# Patient Record
Sex: Female | Born: 1935 | Race: Black or African American | Hispanic: No | Smoking: Never smoker
Health system: Southern US, Community
[De-identification: ages and names within clinical notes are randomized; demographics above are authoritative.]

## PROBLEM LIST (undated history)

## (undated) DIAGNOSIS — C801 Malignant (primary) neoplasm, unspecified: Secondary | ICD-10-CM

## (undated) DIAGNOSIS — E039 Hypothyroidism, unspecified: Secondary | ICD-10-CM

## (undated) DIAGNOSIS — Z950 Presence of cardiac pacemaker: Secondary | ICD-10-CM

## (undated) DIAGNOSIS — I2510 Atherosclerotic heart disease of native coronary artery without angina pectoris: Secondary | ICD-10-CM

## (undated) DIAGNOSIS — R569 Unspecified convulsions: Secondary | ICD-10-CM

## (undated) DIAGNOSIS — I639 Cerebral infarction, unspecified: Secondary | ICD-10-CM

## (undated) DIAGNOSIS — D649 Anemia, unspecified: Secondary | ICD-10-CM

---

## 2016-12-10 ENCOUNTER — Ambulatory Visit
Admission: RE | Admit: 2016-12-10 | Discharge: 2016-12-10 | Disposition: A | Discharge status: Home / Self Care | Payer: Medicare (Managed Care) | Source: Ambulatory Visit | Attending: Obstetrics & Gynecology | Admitting: Obstetrics & Gynecology

## 2016-12-10 DIAGNOSIS — I482 Chronic atrial fibrillation: Secondary | ICD-10-CM | POA: Insufficient documentation

## 2016-12-10 DIAGNOSIS — I110 Hypertensive heart disease with heart failure: Secondary | ICD-10-CM | POA: Insufficient documentation

## 2016-12-10 DIAGNOSIS — I5022 Chronic systolic (congestive) heart failure: Secondary | ICD-10-CM | POA: Diagnosis not present

## 2016-12-10 DIAGNOSIS — Z7982 Long term (current) use of aspirin: Secondary | ICD-10-CM | POA: Diagnosis not present

## 2016-12-10 DIAGNOSIS — Z791 Long term (current) use of non-steroidal anti-inflammatories (NSAID): Secondary | ICD-10-CM | POA: Insufficient documentation

## 2016-12-10 DIAGNOSIS — D509 Iron deficiency anemia, unspecified: Secondary | ICD-10-CM | POA: Insufficient documentation

## 2016-12-10 DIAGNOSIS — I429 Cardiomyopathy, unspecified: Secondary | ICD-10-CM | POA: Insufficient documentation

## 2016-12-10 DIAGNOSIS — Z79899 Other long term (current) drug therapy: Secondary | ICD-10-CM | POA: Diagnosis not present

## 2016-12-10 DIAGNOSIS — Z950 Presence of cardiac pacemaker: Secondary | ICD-10-CM | POA: Diagnosis not present

## 2016-12-10 DIAGNOSIS — Z952 Presence of prosthetic heart valve: Secondary | ICD-10-CM | POA: Diagnosis not present

## 2016-12-10 HISTORY — DX: Hypothyroidism, unspecified: E03.9

## 2016-12-10 HISTORY — DX: Cerebral infarction, unspecified: I63.9

## 2016-12-10 HISTORY — DX: Presence of cardiac pacemaker: Z95.0

## 2016-12-10 HISTORY — DX: Atherosclerotic heart disease of native coronary artery without angina pectoris: I25.10

## 2016-12-10 HISTORY — DX: Anemia, unspecified: D64.9

## 2016-12-10 LAB — HEMOGLOBIN AND HEMATOCRIT, BLOOD
HEMATOCRIT: 20.8 % — AB (ref 35.0–47.0)
Hemoglobin: 6.5 g/dL — ABNORMAL LOW (ref 12.0–16.0)

## 2016-12-10 LAB — ABO/RH: ABO/RH(D): A POS

## 2016-12-10 LAB — PREPARE RBC (CROSSMATCH)

## 2016-12-10 MED ORDER — FUROSEMIDE 10 MG/ML IJ SOLN
INTRAMUSCULAR | Status: AC
  Administered 2016-12-10: 20 mg via INTRAVENOUS
  Filled 2016-12-10: qty 2

## 2016-12-10 MED ORDER — SODIUM CHLORIDE FLUSH 0.9 % IV SOLN
INTRAVENOUS | Status: AC
  Filled 2016-12-10: qty 10

## 2016-12-10 MED ORDER — ACETAMINOPHEN 325 MG PO TABS
650.0000 mg | ORAL_TABLET | Freq: Once | ORAL | Status: AC
  Administered 2016-12-10: 650 mg via ORAL

## 2016-12-10 MED ORDER — FUROSEMIDE 10 MG/ML IJ SOLN
20.0000 mg | Freq: Once | INTRAMUSCULAR | Status: AC
  Administered 2016-12-10: 20 mg via INTRAVENOUS

## 2016-12-10 MED ORDER — ACETAMINOPHEN 325 MG PO TABS
ORAL_TABLET | ORAL | Status: AC
  Administered 2016-12-10: 650 mg via ORAL
  Filled 2016-12-10: qty 2

## 2016-12-10 MED ORDER — DIPHENHYDRAMINE HCL 25 MG PO CAPS
25.0000 mg | ORAL_CAPSULE | Freq: Once | ORAL | Status: AC
  Administered 2016-12-10: 25 mg via ORAL

## 2016-12-10 MED ORDER — DIPHENHYDRAMINE HCL 25 MG PO CAPS
ORAL_CAPSULE | ORAL | Status: AC
  Administered 2016-12-10: 25 mg via ORAL
  Filled 2016-12-10: qty 1

## 2016-12-12 LAB — TYPE AND SCREEN
ABO/RH(D): A POS
Antibody Screen: NEGATIVE
Unit division: 0
Unit division: 0

## 2016-12-12 LAB — BPAM RBC
BLOOD PRODUCT EXPIRATION DATE: 201804092359
BLOOD PRODUCT EXPIRATION DATE: 201804202359
ISSUE DATE / TIME: 201803301238
ISSUE DATE / TIME: 201803301509
UNIT TYPE AND RH: 6200
Unit Type and Rh: 6200

## 2017-04-30 ENCOUNTER — Emergency Department
Admission: EM | Admit: 2017-04-30 | Discharge: 2017-04-30 | Disposition: A | Discharge status: Home / Self Care | Payer: Medicare (Managed Care) | Attending: Emergency Medicine | Admitting: Emergency Medicine

## 2017-04-30 ENCOUNTER — Emergency Department: Payer: Medicare (Managed Care)

## 2017-04-30 ENCOUNTER — Encounter: Payer: Self-pay | Admitting: Emergency Medicine

## 2017-04-30 DIAGNOSIS — Z79899 Other long term (current) drug therapy: Secondary | ICD-10-CM | POA: Diagnosis not present

## 2017-04-30 DIAGNOSIS — Z7982 Long term (current) use of aspirin: Secondary | ICD-10-CM | POA: Diagnosis not present

## 2017-04-30 DIAGNOSIS — R4182 Altered mental status, unspecified: Secondary | ICD-10-CM | POA: Diagnosis present

## 2017-04-30 DIAGNOSIS — Z950 Presence of cardiac pacemaker: Secondary | ICD-10-CM | POA: Insufficient documentation

## 2017-04-30 DIAGNOSIS — Z85038 Personal history of other malignant neoplasm of large intestine: Secondary | ICD-10-CM | POA: Insufficient documentation

## 2017-04-30 DIAGNOSIS — I69354 Hemiplegia and hemiparesis following cerebral infarction affecting left non-dominant side: Secondary | ICD-10-CM | POA: Diagnosis not present

## 2017-04-30 DIAGNOSIS — R55 Syncope and collapse: Secondary | ICD-10-CM | POA: Diagnosis not present

## 2017-04-30 DIAGNOSIS — I2510 Atherosclerotic heart disease of native coronary artery without angina pectoris: Secondary | ICD-10-CM | POA: Insufficient documentation

## 2017-04-30 DIAGNOSIS — E039 Hypothyroidism, unspecified: Secondary | ICD-10-CM | POA: Insufficient documentation

## 2017-04-30 DIAGNOSIS — Z87891 Personal history of nicotine dependence: Secondary | ICD-10-CM | POA: Diagnosis not present

## 2017-04-30 HISTORY — DX: Malignant (primary) neoplasm, unspecified: C80.1

## 2017-04-30 LAB — CBC WITH DIFFERENTIAL/PLATELET
BASOS PCT: 1 %
Basophils Absolute: 0.1 10*3/uL (ref 0–0.1)
EOS ABS: 0.2 10*3/uL (ref 0–0.7)
EOS PCT: 3 %
HEMATOCRIT: 31.2 % — AB (ref 35.0–47.0)
Hemoglobin: 10 g/dL — ABNORMAL LOW (ref 12.0–16.0)
LYMPHS PCT: 46 %
Lymphs Abs: 3 10*3/uL (ref 1.0–3.6)
MCH: 27.2 pg (ref 26.0–34.0)
MCHC: 32.2 g/dL (ref 32.0–36.0)
MCV: 84.6 fL (ref 80.0–100.0)
Monocytes Absolute: 0.5 10*3/uL (ref 0.2–0.9)
Monocytes Relative: 8 %
NEUTROS ABS: 2.7 10*3/uL (ref 1.4–6.5)
Neutrophils Relative %: 42 %
Platelets: 201 10*3/uL (ref 150–440)
RBC: 3.68 MIL/uL — ABNORMAL LOW (ref 3.80–5.20)
RDW: 18.1 % — AB (ref 11.5–14.5)
WBC: 6.5 10*3/uL (ref 3.6–11.0)

## 2017-04-30 LAB — COMPREHENSIVE METABOLIC PANEL
ALBUMIN: 3.9 g/dL (ref 3.5–5.0)
ALT: 20 U/L (ref 14–54)
AST: 42 U/L — AB (ref 15–41)
Alkaline Phosphatase: 66 U/L (ref 38–126)
Anion gap: 11 (ref 5–15)
BUN: 24 mg/dL — AB (ref 6–20)
CHLORIDE: 110 mmol/L (ref 101–111)
CO2: 18 mmol/L — ABNORMAL LOW (ref 22–32)
Calcium: 8.9 mg/dL (ref 8.9–10.3)
Creatinine, Ser: 1.42 mg/dL — ABNORMAL HIGH (ref 0.44–1.00)
GFR calc Af Amer: 37 mL/min — ABNORMAL LOW (ref >60)
GFR calc non Af Amer: 32 mL/min — ABNORMAL LOW (ref >60)
GLUCOSE: 162 mg/dL — AB (ref 65–99)
POTASSIUM: 3.7 mmol/L (ref 3.5–5.1)
Sodium: 139 mmol/L (ref 135–145)
Total Bilirubin: 0.7 mg/dL (ref 0.3–1.2)
Total Protein: 7.6 g/dL (ref 6.5–8.1)

## 2017-04-30 LAB — TYPE AND SCREEN
ABO/RH(D): A POS
ANTIBODY SCREEN: NEGATIVE

## 2017-04-30 LAB — URINALYSIS, COMPLETE (UACMP) WITH MICROSCOPIC
BILIRUBIN URINE: NEGATIVE
Bacteria, UA: NONE SEEN
Glucose, UA: NEGATIVE mg/dL
Hgb urine dipstick: NEGATIVE
Ketones, ur: NEGATIVE mg/dL
LEUKOCYTES UA: NEGATIVE
Nitrite: NEGATIVE
PH: 5 (ref 5.0–8.0)
Protein, ur: 100 mg/dL — AB
SPECIFIC GRAVITY, URINE: 1.015 (ref 1.005–1.030)

## 2017-04-30 LAB — MAGNESIUM: Magnesium: 1.7 mg/dL (ref 1.7–2.4)

## 2017-04-30 LAB — PROTIME-INR
INR: 1.23
PROTHROMBIN TIME: 15.6 s — AB (ref 11.4–15.2)

## 2017-04-30 LAB — GLUCOSE, CAPILLARY: Glucose-Capillary: 89 mg/dL (ref 65–99)

## 2017-04-30 LAB — TROPONIN I

## 2017-04-30 MED ORDER — METOPROLOL TARTRATE 25 MG PO TABS
25.0000 mg | ORAL_TABLET | Freq: Once | ORAL | Status: AC
  Administered 2017-04-30: 25 mg via ORAL
  Filled 2017-04-30: qty 1

## 2017-04-30 MED ORDER — ACETAMINOPHEN 325 MG PO TABS
650.0000 mg | ORAL_TABLET | Freq: Once | ORAL | Status: AC
  Administered 2017-04-30: 650 mg via ORAL
  Filled 2017-04-30: qty 2

## 2017-04-30 MED ORDER — METOPROLOL TARTRATE 5 MG/5ML IV SOLN
5.0000 mg | Freq: Once | INTRAVENOUS | Status: AC
  Administered 2017-04-30: 5 mg via INTRAVENOUS
  Filled 2017-04-30: qty 5

## 2017-04-30 MED ORDER — FENTANYL CITRATE (PF) 100 MCG/2ML IJ SOLN
50.0000 ug | Freq: Once | INTRAMUSCULAR | Status: AC
  Administered 2017-04-30: 25 ug via INTRAVENOUS
  Filled 2017-04-30: qty 2

## 2017-04-30 MED ORDER — FENTANYL CITRATE (PF) 100 MCG/2ML IJ SOLN
INTRAMUSCULAR | Status: AC
  Administered 2017-04-30: 25 ug via INTRAVENOUS
  Filled 2017-04-30: qty 2

## 2017-04-30 NOTE — ED Triage Notes (Addendum)
Patient is baseline nonverbal with left sided weakness S/P stroke. Presents today after having a syncopal episode while using the bathroom. Family states they placed her in the wheelchair and patient was non-responsive with respirations at 4/min. Patient presents to the ED whimpering, moving all 4 extremities. Left sided weakness again noted, but +AROM.   From Home via [place]

## 2017-04-30 NOTE — ED Notes (Signed)
Pt Family Notified of  Room Assignment

## 2017-04-30 NOTE — ED Notes (Signed)
Dr. Leslye Peer to bedside at this time to speak with patient's daughter regarding options of admission and transfer vs not admitting and transferring.

## 2017-04-30 NOTE — ED Provider Notes (Addendum)
[facility] Emergency Department Provider Note  ____________________________________________   I have reviewed the triage vital signs and the nursing notes.   HISTORY  Chief Complaint Altered Mental Status    HPI Linda Petersen is a 83 y.o. female who presents today after having a brief period of unresponsiveness on the toilet this morning. Patient has a history of chronic left-sided weakness, she his wheelchair limited, coronary artery disease hypothyroidism anemia colon cancer endograft of a heart valve, she recently had significant surgery for colon cancer, at that time, in June, she also had had a GI bleed but has not had that since that time. Her metoprolol was increased approximately a week ago, and she's been doing okay since that time. She is apparently full code. This morning, she seemed okay when she woke up. The family helped her onto the toilet as it normally do but when she did not call out after a minute, they went in and found her slumped over. She seemed to be nonresponsive. EMS arrived, according to EMS, patient rapidly returned to being awake alert with reassuring vital signs. Patient could not give any history, nor can she at this time, coLevel 5 chart caveat; no further history available due to patient status. Patient is however awake guarding her airway and will follow some commands.  History is per her son, and EMS. As well as a review of medical records from [facility].   Past Medical History:  Diagnosis Date  . Anemia   . Cancer [facility])    Colon  . Coronary artery disease   . Hypothyroidism   . Presence of permanent cardiac pacemaker   . Stroke [facility])    left side weakness    There are no active problems to display for this patient.   History reviewed. No pertinent surgical history.  Prior to Admission medications   Medication Sig Start Date End Date Taking? Authorizing Provider  aspirin EC 81 MG tablet Take 81 mg by  mouth daily.    [provider]  cholecalciferol (VITAMIN D) 400 units TABS tablet Take 1,000 Units by mouth daily.    [provider]  diclofenac sodium (VOLTAREN) 1 % GEL Apply 2 g topically 2 (two) times daily.    [provider]  ferrous sulfate 325 (65 FE) MG tablet Take 325 mg by mouth 2 (two) times daily with a meal.    [provider]  furosemide (LASIX) 20 MG tablet Take 20 mg by mouth every other day.    [provider]  levothyroxine (SYNTHROID, LEVOTHROID) 50 MCG tablet Take 50 mcg by mouth daily before breakfast.    [provider]  loperamide (IMODIUM) 2 MG capsule Take by mouth as needed for diarrhea or loose stools.    [provider]  losartan (COZAAR) 50 MG tablet Take 50 mg by mouth daily.    [provider]  magnesium oxide (MAG-OX) 400 MG tablet Take 400 mg by mouth daily.    [provider]  metoprolol succinate (TOPROL-XL) 25 MG 24 hr tablet Take 25 mg by mouth daily.    [provider]  pravastatin (PRAVACHOL) 80 MG tablet Take 80 mg by mouth daily.    [provider]  ranitidine (ZANTAC) 150 MG tablet Take 150 mg by mouth 2 (two) times daily.    [provider]    Allergies Patient has no known allergies.  History reviewed. No pertinent family history.  Social History Social History  Substance  Use Topics  . Smoking status: Never Smoker  . Smokeless tobacco: Former Systems developer  . Alcohol use Not on file    Review of Systems, According to family: Constitutional: No fever/chills Eyes: No visual changes. ENT: No sore throat. No stiff neck no neck pain Cardiovascular: Denies chest pain. Respiratory: Denies shortness of breath. Gastrointestinal:   no vomiting.  No diarrhea.  No constipation. Genitourinary: Negative for dysuria. Musculoskeletal: Negative lower extremity swelling Skin: Negative for rash. Neurological: Negative for severe headaches or new, focal  weakness or numbness.   ____________________________________________   PHYSICAL EXAM:  VITAL SIGNS: ED Triage Vitals  Enc Vitals Group     BP 04/30/17 0718 (!) 186/79     Pulse Rate 04/30/17 0718 95     Resp 04/30/17 0718 16     Temp --      Temp src --      SpO2 04/30/17 0718 100 %     Weight 04/30/17 0719 138 lb 8 oz (62.8 kg)     Height --      Head Circumference --      Peak Flow --      Pain Score --      Pain Loc --      Pain Edu? --      Excl. in [place]? --     Constitutional: Chronically ill-appearing, alert, in no acute distress somewhat agitated, guarding her airway, Eyes: Conjunctivae are normal Head: Atraumatic HEENT: No congestion/rhinnorhea. Mucous membranes are moist.  Oropharynx non-erythematous Neck:   Nontender with no meningismus, no masses, no stridor Cardiovascular: Normal rate, regular rhythm. Grossly mildest to murmur appreciated.  Good peripheral circulation. Respiratory: Normal respiratory effort.  No retractions. Lungs CTAB. Abdominal: Soft and nontender. No distention. No guarding no rebound Back:  There is no focal tenderness or step off.  there is no midline tenderness there are no lesions noted. there is no CVA tenderness Musculoskeletal: No lower extremity tenderness, no upper extremity tenderness. No joint effusions, no DVT signs strong distal pulses no edema Neurologic:  Will follow some commands, will not speak to me initially, has a left-sided weakness on the left upper and left lower extremity which is known to be old. Skin:  Skin is warm, dry and intact. No rash noted.  ____________________________________________   LABS (all labs ordered are listed, but only abnormal results are displayed)  Labs Reviewed  CBC WITH DIFFERENTIAL/PLATELET  MAGNESIUM  TROPONIN I  COMPREHENSIVE METABOLIC PANEL  PROTIME-INR  URINALYSIS, COMPLETE (UACMP) WITH MICROSCOPIC  TYPE AND SCREEN   ____________________________________________  EKG  I  personally interpreted any EKGs ordered by me or triage The ventricular  paced rhythm rate 103 mild tachycardia noted, ____________________________________________  RADIOLOGY  I reviewed any imaging ordered by me or triage that were performed during my shift and, if possible, patient and/or family made aware of any abnormal findings. ____________________________________________   PROCEDURES  Procedure(s) performed: None  Procedures  Critical Care performed: CRITICAL CARE Performed by: Schuyler Amor   Total critical care time: 40 minutes  Critical care time was exclusive of separately billable procedures and treating other patients.  Critical care was necessary to treat or prevent imminent or life-threatening deterioration.  Critical care was time spent personally by me on the following activities: development of treatment plan with patient and/or surrogate as well as nursing, discussions with consultants, evaluation of patient's response to treatment, examination of patient, obtaining history from patient or surrogate, ordering and performing treatments and interventions, ordering and  review of laboratory studies, ordering and review of radiographic studies, pulse oximetry and re-evaluation of patient's condition.   ____________________________________________   INITIAL IMPRESSION / ASSESSMENT AND PLAN / ED COURSE  Pertinent labs & imaging results that were available during my care of the patient were reviewed by me and considered in my medical decision making (see chart for details).  Patient here after a syncopal event on the toe this morning. She apparently had "agonal" respirations. EMS was recovered almost immediately upon their arrival. She is in no acute distress, she does seem to be close to her baseline to far as we can determine. Sinuses with her at this time. We will obtain a CT scan of her head given her history altered mental status and multiple medical problems.  There was no report of rectal bleed on the toilet but we will send a type and screen, coags, given recent GI bleed, patient vital signs are reassuring, we will send a urinalysis which we have obtained by catheterized urine, we will obtain basic blood work including magnesium for which she takes supplementation, chest x-ray, cardiac enzymes and we will reassess. We will monitor the patient closely while she is here.  ----------------------------------------- 8:04 AM on 04/30/2017 -----------------------------------------  Patient in CT scan, son feels that she is close to her baseline, she is denying pain to me and him. After some investigation it appears the patient has a Medtronic pacemaker implanted in 2013, we will interrogate.  ----------------------------------------- 8:28 AM on 04/30/2017 -----------------------------------------  Blood work pending, urinalysis reassuring, CT head reassuring, chest x-ray reassuring,  UA reassuring. Family is concerned that she may be in pain, she seems to be agitated to them. I did again talk to the family they state that she did not actually fall off the commode but she "slumped over" and was leaning against a counter on her left. I don't see any obvious evidence of injury but we'll obtain hip x-rays that she seems to be gesturing towards her hips. She is answering somewhat yes or no to questions. They state that she is near her baseline in terms of her ability to speak. She is limited in speech after her stroke, she does not have her false teeth in. They feel that this is about how she would normally be. We will continue to observe her. I'm reluctant to give her narcotic pain medication given her questionable altered mental status prior to coming in. She did, of note, have a normal sugar for EMS. She is awake and medically speaking appears to be stable for the moment we're still trying to workup the various possible etiologies for her  presentation  ----------------------------------------- 9:35 AM on 04/30/2017 -----------------------------------------  Giving the patient pain medication she does have a occult broken hip. Paging the hospitalist and orthopedic surgery. I've also interrogate pacemaker does appear that she had an AV rate that was quite elevated this morning which probably caused her syncopal event. Even though the family states she did not really fall off the toilet slipped over still on the toilet somehow she has sustained a hip fracture. Therefore I think we have determine the etiology of her syncopal event as well as the sequela, we'll treat her with narcotic pain medication and admitted the hospital.   ----------------------------------------- 9:43 AM on 04/30/2017 -----------------------------------------  Giving the patient narcotic pain medication, but the son was requesting that she be transferred to [facility] or her cardiologist are. We are calling [facility]. Given that she has a broken hip as  well as cardiogenic syncope, she'll likely be best placed on the medicine service.  Risks benefits and alternative to transfer understood by family, who insist on transfer.  ----------------------------------------- 10:03 AM on 04/30/2017 -----------------------------------------  Discussed with Dr. Josefa Half, he and I discussed the patient's history and findings, he does agree with 5 of IV Lopressor and administering the patient's oral Lopressor which she has not yet taken today and attempt at atrial rate control which is evidently not sufficient given my Medtronic readouts here. This way, if she has another breakthrough episode of AV with RVR, hopefully she will not syncopize. Patient's pain is very well controlled at this time after fentanyl we'll continue to administer as needed. Awaiting follow-up from [facility]. Family kept appraised of situation.      ----------------------------------------- 10:33 AM on 04/30/2017 -----------------------------------------  Patient resting currently, I have made the family aware that [place], Dr. Neta Mends, excepts the patient however, they have no beds and any other facilities. Family are confident that they personally called her cardiologist they will be able to make a bed for her. I've explained and this is not something I can control however we are happy to accommodate him in any way we can I did also emphasize that we are happy to admit the patient to this facility should they elect to do that and not wait in the emergency room for an indeterminate period of time for either a deep bed to show up for her [facility] cardiologist to arrange for one  ----------------------------------------- 12:01 PM on 04/30/2017 -----------------------------------------  They do not have beds at [facility] I am told and it is unclear when they will have them. They may have somewhat decreased in all but the family's adamant that she should not go there. They do not wish to talk to any orthopedic surgeons here about the patient's hip. They do not wish an orthopedic surgeon consult. They do consent however to admission to this hospital given the likelihood of a long wait for admission to [facility]. Patient is in no acute distress and comfortable at this time. Family does not wish to receive any further fentanyl at this time they state Tylenol should be sufficient for her. We will certainly watch her pain what she is here but at this time she is comfortable. The family has many demands and we are trying to accommodate them as well as possible.   ----------------------------------------- 1:44 PM on 04/30/2017 -----------------------------------------  Pt in nad awaiting either transfer, or admission here. The hospitalist service, in order to forestall possible admission with a subsequent only generated request for transfer which would be much  more difficult to her, the patient,  has reinitiated contact with [facility]. I just received a call back from [facility], they do not have any beds available they do not know when a bed will be available we will again let the family know about this and we will see what they wish to do. It is possible a bed will be available later this afternoon at [facility] and it is possible that will not be oneuntil tomorrow or even later in the future. Family are continuing to decline orthopedic consultation.    ____________________________________________   FINAL CLINICAL IMPRESSION(S) / ED DIAGNOSES  Final diagnoses:  None      This chart was dictated using voice recognition software.  Despite best efforts to proofread,  errors can occur which can change meaning.      Schuyler Amor, MD 04/30/17 [phone]    Burlene Arnt,  Gerda Diss, MD 04/30/17 [id number]    Schuyler Amor, MD 04/30/17 0830    Schuyler Amor, MD 04/30/17 2025    Schuyler Amor, MD 04/30/17 1004    Schuyler Amor, MD 04/30/17 1034    Schuyler Amor, MD 04/30/17 1201    Schuyler Amor, MD 04/30/17 1204    Schuyler Amor, MD 04/30/17 1207    Schuyler Amor, MD 04/30/17 1308    Schuyler Amor, MD 04/30/17 1351

## 2017-04-30 NOTE — ED Notes (Addendum)
Pt's daughter confronted this nurse about administration of metoprolol stating "absolutly not, do you know what narcotics do to the elderly?"  Family education given  Family also shows deep concern over transferring the patient to [place], repeatedly threatening litigation.

## 2017-04-30 NOTE — ED Notes (Signed)
Son requesting pain medication for patient, MD notified. No new Orders

## 2017-04-30 NOTE — ED Notes (Signed)
Please see MD notations regarding possible transfer / patient family's response.

## 2017-04-30 NOTE — Progress Notes (Signed)
Patient ID: Linda Petersen, female   DOB: 10-Apr-1928, 83 y.o.   MRN: [id number] Spoke with the patient's daughter. She would like the patient to be transferred to [facility]. They would not like any operation for consultations if admitted here. The ER physician called the ER and they don't have beds. I called the transfer center and I'm waiting to hear back on whether they can accept the patient. I will hold off on admitting the patient here until I hear back from [facility].  Dr. Loletha Grayer

## 2017-04-30 NOTE — ED Notes (Signed)
Son at bedside.

## 2017-04-30 NOTE — ED Notes (Signed)
Medtronic Pacemaker/Defib Interrogated, awaiting interpretation

## 2017-12-08 ENCOUNTER — Emergency Department: Payer: Medicare (Managed Care)

## 2017-12-08 ENCOUNTER — Encounter: Payer: Self-pay | Admitting: Emergency Medicine

## 2017-12-08 ENCOUNTER — Emergency Department
Admission: EM | Admit: 2017-12-08 | Discharge: 2017-12-08 | Disposition: A | Discharge status: Other Acute Inpatient Hospital | Payer: Medicare (Managed Care) | Attending: Student in an Organized Health Care Education/Training Program | Admitting: Student in an Organized Health Care Education/Training Program

## 2017-12-08 DIAGNOSIS — Z85038 Personal history of other malignant neoplasm of large intestine: Secondary | ICD-10-CM | POA: Insufficient documentation

## 2017-12-08 DIAGNOSIS — R4182 Altered mental status, unspecified: Secondary | ICD-10-CM | POA: Diagnosis present

## 2017-12-08 DIAGNOSIS — Z79899 Other long term (current) drug therapy: Secondary | ICD-10-CM | POA: Insufficient documentation

## 2017-12-08 DIAGNOSIS — Z7901 Long term (current) use of anticoagulants: Secondary | ICD-10-CM | POA: Insufficient documentation

## 2017-12-08 DIAGNOSIS — E039 Hypothyroidism, unspecified: Secondary | ICD-10-CM | POA: Insufficient documentation

## 2017-12-08 DIAGNOSIS — Z8673 Personal history of transient ischemic attack (TIA), and cerebral infarction without residual deficits: Secondary | ICD-10-CM | POA: Insufficient documentation

## 2017-12-08 DIAGNOSIS — R4189 Other symptoms and signs involving cognitive functions and awareness: Secondary | ICD-10-CM

## 2017-12-08 DIAGNOSIS — G40901 Epilepsy, unspecified, not intractable, with status epilepticus: Secondary | ICD-10-CM | POA: Insufficient documentation

## 2017-12-08 DIAGNOSIS — I2510 Atherosclerotic heart disease of native coronary artery without angina pectoris: Secondary | ICD-10-CM | POA: Insufficient documentation

## 2017-12-08 LAB — URINE DRUG SCREEN, QUALITATIVE (ARMC ONLY)
Amphetamines, Ur Screen: NOT DETECTED
BARBITURATES, UR SCREEN: NOT DETECTED
BENZODIAZEPINE, UR SCRN: POSITIVE — AB
CANNABINOID 50 NG, UR ~~LOC~~: NOT DETECTED
Cocaine Metabolite,Ur ~~LOC~~: NOT DETECTED
MDMA (Ecstasy)Ur Screen: NOT DETECTED
Methadone Scn, Ur: NOT DETECTED
OPIATE, UR SCREEN: NOT DETECTED
PHENCYCLIDINE (PCP) UR S: NOT DETECTED
Tricyclic, Ur Screen: NOT DETECTED

## 2017-12-08 LAB — COMPREHENSIVE METABOLIC PANEL
ALBUMIN: 3.8 g/dL (ref 3.5–5.0)
ALK PHOS: 67 U/L (ref 38–126)
ALT: 18 U/L (ref 14–54)
ANION GAP: 18 — AB (ref 5–15)
AST: 52 U/L — ABNORMAL HIGH (ref 15–41)
BUN: 30 mg/dL — AB (ref 6–20)
CO2: 18 mmol/L — ABNORMAL LOW (ref 22–32)
CREATININE: 1.58 mg/dL — AB (ref 0.44–1.00)
Calcium: 9.1 mg/dL (ref 8.9–10.3)
Chloride: 101 mmol/L (ref 101–111)
GFR calc Af Amer: 33 mL/min — ABNORMAL LOW (ref >60)
GFR calc non Af Amer: 28 mL/min — ABNORMAL LOW (ref >60)
GLUCOSE: 160 mg/dL — AB (ref 65–99)
Potassium: 3.7 mmol/L (ref 3.5–5.1)
Sodium: 137 mmol/L (ref 135–145)
TOTAL PROTEIN: 7.8 g/dL (ref 6.5–8.1)
Total Bilirubin: 0.5 mg/dL (ref 0.3–1.2)

## 2017-12-08 LAB — BLOOD GAS, ARTERIAL
Acid-Base Excess: 0.7 mmol/L (ref 0.0–2.0)
Bicarbonate: 21.1 mmol/L (ref 20.0–28.0)
DRAWN BY: 449561
FIO2: 35
O2 Saturation: 97.8 %
PEEP: 5 cmH2O
Patient temperature: 37
RATE: 16 resp/min
VT: 450 mL
pCO2 arterial: 22 mmHg — ABNORMAL LOW (ref 32.0–48.0)
pH, Arterial: 7.59 — ABNORMAL HIGH (ref 7.350–7.450)
pO2, Arterial: 84 mmHg (ref 83.0–108.0)

## 2017-12-08 LAB — DIFFERENTIAL
BASOS ABS: 0 10*3/uL (ref 0–0.1)
Band Neutrophils: 3 %
Basophils Relative: 0 %
EOS PCT: 1 %
Eosinophils Absolute: 0.1 10*3/uL (ref 0–0.7)
Lymphocytes Relative: 52 %
Lymphs Abs: 2.8 10*3/uL (ref 1.0–3.6)
METAMYELOCYTES PCT: 1 %
MONOS PCT: 6 %
Monocytes Absolute: 0.3 10*3/uL (ref 0.2–0.9)
Neutro Abs: 2.2 10*3/uL (ref 1.4–6.5)
Neutrophils Relative %: 37 %

## 2017-12-08 LAB — CBC
HCT: 36.5 % (ref 35.0–47.0)
Hemoglobin: 11.8 g/dL — ABNORMAL LOW (ref 12.0–16.0)
MCH: 29.8 pg (ref 26.0–34.0)
MCHC: 32.2 g/dL (ref 32.0–36.0)
MCV: 92.6 fL (ref 80.0–100.0)
PLATELETS: 182 10*3/uL (ref 150–440)
RBC: 3.94 MIL/uL (ref 3.80–5.20)
RDW: 18.1 % — AB (ref 11.5–14.5)
WBC: 5.4 10*3/uL (ref 3.6–11.0)

## 2017-12-08 LAB — APTT: aPTT: 30 seconds (ref 24–36)

## 2017-12-08 LAB — PROTIME-INR
INR: 1.26
PROTHROMBIN TIME: 15.7 s — AB (ref 11.4–15.2)

## 2017-12-08 LAB — ETHANOL: Alcohol, Ethyl (B): <10 mg/dL (ref <10)

## 2017-12-08 LAB — TROPONIN I: Troponin I: 0.03 ng/mL (ref <0.03)

## 2017-12-08 MED ORDER — ETOMIDATE 2 MG/ML IV SOLN
20.0000 mg | Freq: Once | INTRAVENOUS | Status: AC
  Administered 2017-12-08: 20 mg via INTRAVENOUS

## 2017-12-08 MED ORDER — SODIUM CHLORIDE 0.9 % IV SOLN
Freq: Once | INTRAVENOUS | Status: AC
  Administered 2017-12-08: 16:00:00 via INTRAVENOUS

## 2017-12-08 MED ORDER — SODIUM CHLORIDE 0.9 % IV SOLN: 40.0000 ug/h | INTRAVENOUS | Status: DC

## 2017-12-08 MED ORDER — SODIUM CHLORIDE 0.9 % IV SOLN
Freq: Once | INTRAVENOUS | Status: AC
  Administered 2017-12-08: 16:00:00 via INTRAVENOUS
  Filled 2017-12-08: qty 10

## 2017-12-08 MED ORDER — FENTANYL 2500MCG IN NS 250ML (10MCG/ML) PREMIX INFUSION
40.0000 ug/h | INTRAVENOUS | Status: DC
  Filled 2017-12-08: qty 250

## 2017-12-08 MED ORDER — MIDAZOLAM HCL 50 MG/10ML IJ SOLN
0.5000 mg/h | INTRAMUSCULAR | Status: DC
  Administered 2017-12-08: 0.5 mg/h via INTRAVENOUS
  Filled 2017-12-08: qty 10

## 2017-12-08 MED ORDER — LEVETIRACETAM IN NACL 1000 MG/100ML IV SOLN: 1000.0000 mg | Freq: Once | INTRAVENOUS | Status: DC

## 2017-12-08 MED ORDER — IOPAMIDOL (ISOVUE-370) INJECTION 76%
60.0000 mL | Freq: Once | INTRAVENOUS | Status: AC | PRN
  Administered 2017-12-08: 16:00:00 via INTRAVENOUS

## 2017-12-08 MED ORDER — FENTANYL CITRATE (PF) 100 MCG/2ML IJ SOLN
100.0000 ug | Freq: Once | INTRAMUSCULAR | Status: AC
  Administered 2017-12-08: 100 ug via INTRAVENOUS

## 2017-12-08 MED ORDER — ROCURONIUM BROMIDE 100 MG/10ML IV SOLN
100.0000 mg | Freq: Once | INTRAVENOUS | Status: AC
  Administered 2017-12-08: 100 mg via INTRAVENOUS
  Filled 2017-12-08: qty 10

## 2017-12-08 MED ORDER — FENTANYL CITRATE (PF) 100 MCG/2ML IJ SOLN
INTRAMUSCULAR | Status: AC
  Administered 2017-12-08: 100 ug via INTRAVENOUS
  Filled 2017-12-08: qty 2

## 2017-12-08 MED ORDER — MIDAZOLAM HCL 5 MG/5ML IJ SOLN
1.0000 mg | Freq: Once | INTRAMUSCULAR | Status: AC
  Administered 2017-12-08: 1 mg via INTRAVENOUS
  Filled 2017-12-08: qty 5

## 2017-12-08 NOTE — ED Provider Notes (Signed)
[facility] Emergency Department Provider Note    First MD Initiated Contact with Patient 12/08/17 1512     (approximate)  I have reviewed the triage vital signs and the nursing notes.   HISTORY  Chief Complaint unresponsive  Level V caveat:  unresponsive  HPI Linda Petersen is a 84 y.o. female history of stroke reported residual left-sided weakness presents with altered mental status and unresponsive.  Was reportedly working with staff at [facility] started having left-sided weakness as well as slurred speech and altered mental status.  They called EMS.  Was reportedly around an hour prior to arrival.  While getting into EMS patient had a generalized tonic-clonic seizure and was given Versed.  Subsequently she was having agonal breathing and apnea spells.  Arrives to the ER unresponsive with a GCS of 3 but spontaneous respirations.  Past Medical History:  Diagnosis Date  . Anemia   . Cancer [facility])    Colon  . Coronary artery disease   . Hypothyroidism   . Presence of permanent cardiac pacemaker   . Stroke [facility])    left side weakness   No family history on file. History reviewed. No pertinent surgical history. There are no active problems to display for this patient.     Prior to Admission medications   Medication Sig Start Date End Date Taking? Authorizing Provider  acetaminophen (TYLENOL) 500 MG tablet Take 500 mg by mouth every 4 (four) hours as needed for mild pain or moderate pain.   Yes [provider]  alum & mag hydroxide-simeth (MYLANTA) 200-200-20 MG/5ML suspension Take 10 mLs by mouth every 6 (six) hours as needed for indigestion, heartburn or flatulence.   Yes [provider]  apixaban (ELIQUIS) 2.5 MG TABS tablet Take 2.5 mg by mouth every 12 (twelve) hours.   Yes [provider]  cholecalciferol (VITAMIN D) 1000 units tablet Take 1,000 Units by mouth daily.   Yes [provider]    ferrous sulfate 325 (65 FE) MG tablet Take 325 mg by mouth every Monday, Wednesday, and Friday.    Yes [provider]  furosemide (LASIX) 40 MG tablet Take 40 mg by mouth daily.   Yes [provider]  geriatric multivitamins-minerals (ELDERTONIC/GEVRABON) ELIX Take 10 mLs by mouth daily.   Yes [provider]  ibandronate (BONIVA) 150 MG tablet Take 150 mg by mouth every 30 (thirty) days. On the first Sunday of every month   Yes [provider]  levothyroxine (SYNTHROID, LEVOTHROID) 50 MCG tablet Take 75 mcg by mouth daily before breakfast.    Yes [provider]  loperamide (IMODIUM) 2 MG capsule Take 2-4 mg by mouth as needed for diarrhea or loose stools.    Yes [provider]  magnesium oxide (MAG-OX) 400 MG tablet Take 400 mg by mouth daily.   Yes [provider]  metoprolol succinate (TOPROL-XL) 25 MG 24 hr tablet Take 25 mg by mouth daily.   Yes [provider]  Multiple Vitamins-Minerals (CERTAVITE SENIOR/ANTIOXIDANT) TABS Take 1 tablet by mouth daily.   Yes [provider]  oseltamivir (TAMIFLU) 30 MG capsule Take 30 mg by mouth every other day. 12/08/17 12/12/17 Yes [provider]  pravastatin (PRAVACHOL) 80 MG tablet Take 80 mg by mouth at bedtime.    Yes [provider]  ranitidine (ZANTAC) 150 MG tablet Take 150 mg by mouth 2 (two) times daily.   Yes [provider]  senna-docusate (SENOKOT-S) 8.6-50 MG  tablet Take 1 tablet by mouth every 12 (twelve) hours as needed for mild constipation.   Yes [provider]  sodium chloride (OCEAN) 0.65 % SOLN nasal spray Place 1 spray into both nostrils 3 (three) times daily.   Yes [provider]    Allergies Oxycodone    Social History Social History   Tobacco Use  . Smoking status: Never Smoker  . Smokeless tobacco: Former Network engineer Use Topics  . Alcohol use: Not on file  . Drug use: Not on file     Review of Systems Patient denies headaches, rhinorrhea, blurry vision, numbness, shortness of breath, chest pain, edema, cough, abdominal pain, nausea, vomiting, diarrhea, dysuria, fevers, rashes or hallucinations unless otherwise stated above in HPI. ____________________________________________   PHYSICAL EXAM:  VITAL SIGNS: Vitals:   12/08/17 1730 12/08/17 1745  BP: (!) 161/96 (!) 158/95  Pulse: 78   Resp: (!) 25 (!) 22  Temp: (!) 96.3 F (35.7 C) (!) 96.4 F (35.8 C)  SpO2: 100%     Constitutional: Critically ill-appearing but with spontaneous respirations Eyes: Conjunctivae are normal. Pupils 30mm bilaterally Head: Atraumatic. Nose: No congestion/rhinnorhea. Mouth/Throat: Mucous membranes are moist.   Neck: No stridor. Painless ROM.  Cardiovascular: Normal rate, regular rhythm. Grossly normal heart sounds.  Good peripheral circulation. Respiratory: Normal respiratory effort.  No retractions. Lungs CTAB. Gastrointestinal: Soft and nontender. No distention. No abdominal bruits. No CVA tenderness. Genitourinary:  Musculoskeletal: No lower extremity tenderness nor edema.  No joint effusions. Neurologic:  GCS 3. No clonus, spontaneous respirations Skin:  Skin is warm, dry and intact. No rash noted.  ____________________________________________   LABS (all labs ordered are listed, but only abnormal results are displayed)  Results for orders placed or performed during the hospital encounter of 12/08/17 (from the past 24 hour(s))  Ethanol     Status: None   Collection Time: 12/08/17  3:10 PM  Result Value Ref Range   Alcohol, Ethyl (B) <10 <10 mg/dL  Protime-INR     Status: Abnormal   Collection Time: 12/08/17  3:10 PM  Result Value Ref Range   Prothrombin Time 15.7 (H) 11.4 - 15.2 seconds   INR 1.26   APTT     Status: None   Collection Time: 12/08/17  3:10 PM  Result Value Ref Range   aPTT 30 24 - 36 seconds  CBC     Status: Abnormal   Collection Time: 12/08/17   3:10 PM  Result Value Ref Range   WBC 5.4 3.6 - 11.0 K/uL   RBC 3.94 3.80 - 5.20 MIL/uL   Hemoglobin 11.8 (L) 12.0 - 16.0 g/dL   HCT 36.5 35.0 - 47.0 %   MCV 92.6 80.0 - 100.0 fL   MCH 29.8 26.0 - 34.0 pg   MCHC 32.2 32.0 - 36.0 g/dL   RDW 18.1 (H) 11.5 - 14.5 %   Platelets 182 150 - 440 K/uL  Differential     Status: None   Collection Time: 12/08/17  3:10 PM  Result Value Ref Range   Neutrophils Relative % 37 %   Lymphocytes Relative 52 %   Monocytes Relative 6 %   Eosinophils Relative 1 %   Basophils Relative 0 %   Band Neutrophils 3 %   Metamyelocytes Relative 1 %   Neutro Abs 2.2 1.4 - 6.5 K/uL   Lymphs Abs 2.8 1.0 - 3.6 K/uL   Monocytes Absolute 0.3 0.2 - 0.9 K/uL   Eosinophils Absolute 0.1 0 - 0.7  K/uL   Basophils Absolute 0.0 0 - 0.1 K/uL   RBC Morphology MIXED RBC POPULATION   Comprehensive metabolic panel     Status: Abnormal   Collection Time: 12/08/17  3:10 PM  Result Value Ref Range   Sodium 137 135 - 145 mmol/L   Potassium 3.7 3.5 - 5.1 mmol/L   Chloride 101 101 - 111 mmol/L   CO2 18 (L) 22 - 32 mmol/L   Glucose, Bld 160 (H) 65 - 99 mg/dL   BUN 30 (H) 6 - 20 mg/dL   Creatinine, Ser 1.58 (H) 0.44 - 1.00 mg/dL   Calcium 9.1 8.9 - 10.3 mg/dL   Total Protein 7.8 6.5 - 8.1 g/dL   Albumin 3.8 3.5 - 5.0 g/dL   AST 52 (H) 15 - 41 U/L   ALT 18 14 - 54 U/L   Alkaline Phosphatase 67 38 - 126 U/L   Total Bilirubin 0.5 0.3 - 1.2 mg/dL   GFR calc non Af Amer 28 (L) >60 mL/min   GFR calc Af Amer 33 (L) >60 mL/min   Anion gap 18 (H) 5 - 15  Troponin I     Status: Abnormal   Collection Time: 12/08/17  3:10 PM  Result Value Ref Range   Troponin I 0.03 (HH) <0.03 ng/mL  Urine Drug Screen, Qualitative     Status: Abnormal   Collection Time: 12/08/17  3:50 PM  Result Value Ref Range   Tricyclic, Ur Screen NONE DETECTED NONE DETECTED   Amphetamines, Ur Screen NONE DETECTED NONE DETECTED   MDMA (Ecstasy)Ur Screen NONE DETECTED NONE DETECTED   Cocaine Metabolite,Ur [place]  NONE DETECTED NONE DETECTED   Opiate, Ur Screen NONE DETECTED NONE DETECTED   Phencyclidine (PCP) Ur S NONE DETECTED NONE DETECTED   Cannabinoid 50 Ng, Ur [place] NONE DETECTED NONE DETECTED   Barbiturates, Ur Screen NONE DETECTED NONE DETECTED   Benzodiazepine, Ur Scrn POSITIVE (A) NONE DETECTED   Methadone Scn, Ur NONE DETECTED NONE DETECTED  Blood gas, arterial     Status: Abnormal   Collection Time: 12/08/17  4:24 PM  Result Value Ref Range   FIO2 35.00    Delivery systems VENTILATOR    Mode ASSIST CONTROL    VT 450 mL   LHR 16 resp/min   Peep/cpap 5.0 cm H20   pH, Arterial 7.59 (H) 7.350 - 7.450   pCO2 arterial 22 (L) 32.0 - 48.0 mmHg   pO2, Arterial 84 83.0 - 108.0 mmHg   Bicarbonate 21.1 20.0 - 28.0 mmol/L   Acid-Base Excess 0.7 0.0 - 2.0 mmol/L   O2 Saturation 97.8 %   Patient temperature 37.0    Collection site BRACHIAL ARTERY    Drawn by [id number]    Sample type ARTERIAL DRAW    ____________________________________________  EKG My review and personal interpretation at Time: 15:06   Indication: ams  Rate: 90  Rhythm: a sensed - v paced Axis: left Other: normal intervals, no stemi ____________________________________________  RADIOLOGY  I personally reviewed all radiographic images ordered to evaluate for the above acute complaints and reviewed radiology reports and findings.  These findings were personally discussed with the patient.  Please see medical record for radiology report.  ____________________________________________   PROCEDURES  Procedure(s) performed:  .Critical Care Performed by: Merlyn Lot, MD Authorized by: Merlyn Lot, MD   Critical care provider statement:    Critical care time (minutes):  50   Critical care time was exclusive of:  Separately billable procedures and treating other  patients   Critical care was necessary to treat or prevent imminent or life-threatening deterioration of the following conditions:  CNS failure or  compromise and respiratory failure   Critical care was time spent personally by me on the following activities:  Development of treatment plan with patient or surrogate, discussions with consultants, evaluation of patient's response to treatment, examination of patient, obtaining history from patient or surrogate, ordering and performing treatments and interventions, ordering and review of laboratory studies, ordering and review of radiographic studies, pulse oximetry, re-evaluation of patient's condition and review of old charts  Procedure Name: Intubation Date/Time: 12/08/2017 8:00 PM Performed by: Merlyn Lot, MD Pre-anesthesia Checklist: Patient identified, Emergency Drugs available, Suction available and Patient being monitored Preoxygenation: Pre-oxygenation with 100% oxygen Induction Type: IV induction and Rapid sequence Laryngoscope Size: Glidescope Grade View: Grade I Tube size: 7.5 mm Number of attempts: 1 Airway Equipment and Method: Video-laryngoscopy Placement Confirmation: ETT inserted through vocal cords under direct vision,  CO2 detector and Breath sounds checked- equal and bilateral Dental Injury: Teeth and Oropharynx as per pre-operative assessment          Critical Care performed: yes ____________________________________________   INITIAL IMPRESSION / ASSESSMENT AND PLAN / ED COURSE  Pertinent labs & imaging results that were available during my care of the patient were reviewed by me and considered in my medical decision making (see chart for details).  DDX: Dehydration, sepsis, pna, uti, hypoglycemia, cva, drug effect, withdrawal, encephalitis   Linda Petersen is a 84 y.o. who presents to the ED with the as described above.  Patient critically ill.  Due to her unresponsiveness concern for the above and concern the patient not protecting her airway patient intubated for airway protection.  She taken emergently to CT head.  Clinical Course as of Dec 08 1753  Thu Dec 08, 2017  1645 CT angiogram shows no evidence of LV.  I am concerned for status.  We will continue patient on Versed drip she has received Keppra.  Currently speaking with [facility] to see if patient can be transferred to their facility at the patient's family request.   [PR]  1701 She reassessed.  Family updated at bedside.  Patient has been accepted to neuro ICU bed at [facility].   [PR]    Clinical Course User Index [PR] Merlyn Lot, MD   Reassessed.  Does seem to be moving somewhat but not purposeful.  Remains hemodynamically stable on fentanyl and Versed.  Remains critically ill with poor prognosis but stabilized for transfer.  As part of my medical decision making, I reviewed the following data within the [place] notes reviewed and incorporated, Labs reviewed, notes from prior ED visits and [place] Controlled Substance Database   ____________________________________________   FINAL CLINICAL IMPRESSION(S) / ED DIAGNOSES  Final diagnoses:  Status epilepticus ([place])  Unresponsiveness      NEW MEDICATIONS STARTED DURING THIS VISIT:  New Prescriptions   No medications on file     Note:  This document was prepared using Dragon voice recognition software and may include unintentional dictation errors.    Merlyn Lot, MD 12/08/17 2001

## 2017-12-08 NOTE — Consult Note (Addendum)
[facility] Tele[facility]urology Consult Services  Impression: Seizure, r/o Acute stroke.  Given the deep coma, need to r/o basilar thrombus. It may be related to post-ictal phenomena or it may be medication related but coma is deeper than what would be expected this far out from induction.  Not a tpa candidate due to: eliquis Will do CTA now  Comments:   [facility] contacted: 1610 [facility] at bedside: 1527 NIHSS assessment time: 1532  Recommendations:   Continue Eliquis when able to swallow.  CTA Keppra load now Further inpatient evaluation as per Neurology/ Internal Medicine Discussed with ED MD  History of Present Illness   Patient is a 84 yo F with h/o afib on eliquis, pacer, HTN, prior R MCA stroke with baseline left sided weakness.  Pt was at her NH when she developed worsening left sided weakness noted at 215 EMS was called and en route, she had a generalized seizure. She got 2 versed at that point.  On arrival to the ED, she was GCS 3, not protecting airway. Intubated with Roc and Etom HCT shows large chronic right MCA infarct, no bleeding Post hct, she remains in deep coma without appreciable motor responses or brainstem responses.   Exam   Intubated, not currently on sedation Pinpoint pupils, no OCR No gag with deep suction or adjustment of ET tube No grimace to nox stim No wd of the extremities to nox stim.   Medical Decision Making:  - Extensive number of diagnosis or management options are considered above.   - Extensive amount of complex data reviewed.   - High risk of complication and/or morbidity or mortality are associated with differential diagnostic considerations above.  - There may be Uncertain outcome and increased probability of prolonged functional impairment or high probability of severe prolonged functional impairment associated with some of these differential diagnosis.  Medical Data Reviewed:  1.Data reviewed include clinical labs,  radiology,  Medical Tests;   2.Tests results discussed w/performing or interpreting physician;   3.Obtaining/reviewing old medical records;  4.Obtaining case history from another source;  5.Independent review of image, tracing or specimen.

## 2017-12-08 NOTE — ED Notes (Signed)
Fentanyl drip was taken with [employer]. (Transfer/Transfusing). Charge Nurse Raquel stated this was ok and to chart it.

## 2017-12-08 NOTE — ED Notes (Signed)
This RN received versed drip from pharmacy tech.  Spoke with Linda Nine, RN primary nurse and was asked to hang versed drip for patient due to primary RN being in another patients room.  Educated patient's son regarding use for drip.  Verbalized understanding.

## 2017-12-08 NOTE — Progress Notes (Signed)
CH was introduced to PT. CH was instructed that PT's son was in waiting area. CH was informed that PT possibly would be transferring to [facility]. CH provided silent prayer for patient and attempted to locate family members.

## 2017-12-08 NOTE — Progress Notes (Signed)
[place] follow up while on another page in emergency room. PT being transported to [facility]. Silent prayer offered.

## 2017-12-08 NOTE — ED Notes (Signed)
EMTALA checked for completion  

## 2017-12-08 NOTE — ED Notes (Signed)
Etomidate given 1508 by RN Cassie, 100 rocuronium given by RN Rubin Payor

## 2017-12-08 NOTE — Code Documentation (Signed)
Pt arrives via EMS from [facility], per report pt was walking with her walker at 1400 when she had sudden left sided weakness and slurred speech, in route to hospital pt seized, received versed and then demonstrated agonal respirations, pt unresponsive and intubated upon arrival to ED, hx of CVA with residual left side weakness, per facility documentation pt on eliquis, NIHSS 28, pt not responsive to painful stimuli, no tPA due to eliquis, CTA ordered, report off to [facility]

## 2017-12-08 NOTE — ED Triage Notes (Addendum)
Pt arrived via ems from [facility]. Pt has left sided deficits at baseline.1 hour prior to arrival, pt had noticeable increased left sided weakness and ems was called. Upon ems arrival pt was talking to ems and in route to ED, Pt had seizure and given 2mg  versed. Upon arrival pt unresponsive, pupils 89mm and fixed bilaterally. Breathing independent but no reponse to pain.

## 2017-12-08 NOTE — Progress Notes (Signed)
   12/08/17 1545  Clinical Encounter Type  Visited With Family  Visit Type Initial  Referral From Nurse  Consult/Referral To Chaplain  Spiritual Encounters  Spiritual Needs Emotional   CH reported to ED to meet PT's family and assist with getting family information about PT. Will escort family to PT's RM when available.

## 2019-02-04 ENCOUNTER — Emergency Department: Payer: Medicare (Managed Care)

## 2019-02-04 ENCOUNTER — Other Ambulatory Visit: Payer: Self-pay

## 2019-02-04 ENCOUNTER — Emergency Department
Admission: EM | Admit: 2019-02-04 | Discharge: 2019-02-04 | Disposition: A | Discharge status: Home / Self Care | Payer: Medicare (Managed Care) | Attending: Emergency Medicine | Admitting: Emergency Medicine

## 2019-02-04 DIAGNOSIS — E039 Hypothyroidism, unspecified: Secondary | ICD-10-CM | POA: Insufficient documentation

## 2019-02-04 DIAGNOSIS — Z79899 Other long term (current) drug therapy: Secondary | ICD-10-CM | POA: Insufficient documentation

## 2019-02-04 DIAGNOSIS — R4182 Altered mental status, unspecified: Secondary | ICD-10-CM | POA: Diagnosis present

## 2019-02-04 DIAGNOSIS — N390 Urinary tract infection, site not specified: Secondary | ICD-10-CM | POA: Diagnosis not present

## 2019-02-04 DIAGNOSIS — I2510 Atherosclerotic heart disease of native coronary artery without angina pectoris: Secondary | ICD-10-CM | POA: Diagnosis not present

## 2019-02-04 DIAGNOSIS — Z7901 Long term (current) use of anticoagulants: Secondary | ICD-10-CM | POA: Diagnosis not present

## 2019-02-04 DIAGNOSIS — E876 Hypokalemia: Secondary | ICD-10-CM | POA: Diagnosis not present

## 2019-02-04 DIAGNOSIS — Z85038 Personal history of other malignant neoplasm of large intestine: Secondary | ICD-10-CM | POA: Insufficient documentation

## 2019-02-04 DIAGNOSIS — Z8673 Personal history of transient ischemic attack (TIA), and cerebral infarction without residual deficits: Secondary | ICD-10-CM | POA: Insufficient documentation

## 2019-02-04 DIAGNOSIS — Z950 Presence of cardiac pacemaker: Secondary | ICD-10-CM | POA: Insufficient documentation

## 2019-02-04 LAB — CBC WITH DIFFERENTIAL/PLATELET
Abs Immature Granulocytes: 0.02 10*3/uL (ref 0.00–0.07)
Basophils Absolute: 0.1 10*3/uL (ref 0.0–0.1)
Basophils Relative: 1 %
Eosinophils Absolute: 0.1 10*3/uL (ref 0.0–0.5)
Eosinophils Relative: 1 %
HCT: 36.5 % (ref 36.0–46.0)
Hemoglobin: 12.3 g/dL (ref 12.0–15.0)
Immature Granulocytes: 0 %
Lymphocytes Relative: 26 %
Lymphs Abs: 1.2 10*3/uL (ref 0.7–4.0)
MCH: 30.8 pg (ref 26.0–34.0)
MCHC: 33.7 g/dL (ref 30.0–36.0)
MCV: 91.3 fL (ref 80.0–100.0)
Monocytes Absolute: 0.6 10*3/uL (ref 0.1–1.0)
Monocytes Relative: 13 %
Neutro Abs: 2.8 10*3/uL (ref 1.7–7.7)
Neutrophils Relative %: 59 %
Platelets: 172 10*3/uL (ref 150–400)
RBC: 4 MIL/uL (ref 3.87–5.11)
RDW: 14.8 % (ref 11.5–15.5)
WBC: 4.7 10*3/uL (ref 4.0–10.5)
nRBC: 0 % (ref 0.0–0.2)

## 2019-02-04 LAB — COMPREHENSIVE METABOLIC PANEL
ALT: 12 U/L (ref 0–44)
AST: 32 U/L (ref 15–41)
Albumin: 4 g/dL (ref 3.5–5.0)
Alkaline Phosphatase: 77 U/L (ref 38–126)
Anion gap: 10 (ref 5–15)
BUN: 20 mg/dL (ref 8–23)
CO2: 26 mmol/L (ref 22–32)
Calcium: 8.9 mg/dL (ref 8.9–10.3)
Chloride: 104 mmol/L (ref 98–111)
Creatinine, Ser: 1.15 mg/dL — ABNORMAL HIGH (ref 0.44–1.00)
GFR calc Af Amer: 49 mL/min — ABNORMAL LOW (ref >60)
GFR calc non Af Amer: 42 mL/min — ABNORMAL LOW (ref >60)
Glucose, Bld: 101 mg/dL — ABNORMAL HIGH (ref 70–99)
Potassium: 3.2 mmol/L — ABNORMAL LOW (ref 3.5–5.1)
Sodium: 140 mmol/L (ref 135–145)
Total Bilirubin: 0.8 mg/dL (ref 0.3–1.2)
Total Protein: 7.7 g/dL (ref 6.5–8.1)

## 2019-02-04 LAB — URINALYSIS, COMPLETE (UACMP) WITH MICROSCOPIC
Bacteria, UA: NONE SEEN
Bilirubin Urine: NEGATIVE
Glucose, UA: NEGATIVE mg/dL
Hgb urine dipstick: NEGATIVE
Ketones, ur: NEGATIVE mg/dL
Nitrite: NEGATIVE
Protein, ur: NEGATIVE mg/dL
Specific Gravity, Urine: 1.01 (ref 1.005–1.030)
pH: 6 (ref 5.0–8.0)

## 2019-02-04 LAB — LACTIC ACID, PLASMA: Lactic Acid, Venous: 1.8 mmol/L (ref 0.5–1.9)

## 2019-02-04 LAB — TROPONIN I: Troponin I: <0.03 ng/mL (ref <0.03)

## 2019-02-04 MED ORDER — SODIUM CHLORIDE 0.9 % IV SOLN
1.0000 g | Freq: Once | INTRAVENOUS | Status: AC
  Administered 2019-02-04: 1 g via INTRAVENOUS
  Filled 2019-02-04: qty 10

## 2019-02-04 MED ORDER — POTASSIUM CHLORIDE CRYS ER 20 MEQ PO TBCR: 20.0000 meq | EXTENDED_RELEASE_TABLET | Freq: Once | ORAL | Status: DC

## 2019-02-04 MED ORDER — POTASSIUM CHLORIDE ER 10 MEQ PO TBCR: 10.0000 meq | EXTENDED_RELEASE_TABLET | Freq: Every day | ORAL | 0 refills | Status: DC

## 2019-02-04 MED ORDER — POTASSIUM CHLORIDE 20 MEQ PO PACK
20.0000 meq | PACK | Freq: Once | ORAL | Status: AC
  Administered 2019-02-04: 21:00:00 20 meq via ORAL
  Filled 2019-02-04: qty 1

## 2019-02-04 MED ORDER — CEFDINIR 300 MG PO CAPS: 300.0000 mg | ORAL_CAPSULE | Freq: Two times a day (BID) | ORAL | 0 refills | Status: DC

## 2019-02-04 NOTE — ED Notes (Addendum)
Son called and stated that UTI can cause these types of symptoms and that it has happened before in the past and would like to be called with updates Linda Petersen [phone])

## 2019-02-04 NOTE — ED Notes (Signed)
Verified pt son Linda Petersen, phone number is listed correctly

## 2019-02-04 NOTE — ED Notes (Signed)
Pt stated she needed to use the bathroom but states she doesn't walk when asked. Placed on bedpan. Did not urinate. Brief on pt was dry.

## 2019-02-04 NOTE — ED Notes (Signed)
Pt back to room from CT

## 2019-02-04 NOTE — ED Notes (Signed)
Pt in CT.

## 2019-02-04 NOTE — ED Notes (Signed)
Discharge instructions reviewed with son. Verbal consent given for discharge.

## 2019-02-04 NOTE — Discharge Instructions (Addendum)
Follow-up with your regular doctor as needed.  Return emergency department if worsening.  Take medication as prescribed.  Have your potassium level rechecked in 1 week.  You may cut these pills in half to take them.

## 2019-02-04 NOTE — ED Triage Notes (Signed)
Pt arrives from home via [facility] for [facility]. Family states she hasn't been acting herself, seems a little confused to them. Family told EMS that pt has has seizure hx and they thought she was having focal seizures earlier today. Pt arrives talking, unable to answer questions correctly. Alert to name, DOB, location but unsure what is going on or year. Speech delayed. Hx stroke. Appears to have L arm deficit. Pt denies walking at home. Has no complaints upon arrival.

## 2019-02-04 NOTE — ED Provider Notes (Signed)
[facility] Emergency Department Provider Note  ____________________________________________   None    (approximate)  I have reviewed the triage vital signs and the nursing notes.   HISTORY  Chief Complaint Altered Mental Status    HPI Linda Petersen is a 85 y.o. female presents emergency department with altered mental status.  Family states that she is not been acting normal and this happened when she had a UTI before.  They deny that she has had fever or chills.  No known COVID exposure.  No chest pain or shortness of breath.    Past Medical History:  Diagnosis Date   Anemia    Cancer ([place])    Colon   Coronary artery disease    Hypothyroidism    Presence of permanent cardiac pacemaker    Stroke [facility])    left side weakness    There are no active problems to display for this patient.   History reviewed. No pertinent surgical history.  Prior to Admission medications   Medication Sig Start Date End Date Taking? Authorizing Provider  acetaminophen (TYLENOL) 500 MG tablet Take 500 mg by mouth every 4 (four) hours as needed for mild pain or moderate pain.   Yes [provider]  apixaban (ELIQUIS) 2.5 MG TABS tablet Take 2.5 mg by mouth every 12 (twelve) hours.   Yes [provider]  famotidine (PEPCID) 20 MG tablet Take 20 mg by mouth 2 (two) times daily.   Yes [provider]  ferrous sulfate 325 (65 FE) MG tablet Take 325 mg by mouth every Monday, Wednesday, and Friday.    Yes [provider]  furosemide (LASIX) 40 MG tablet Take 40 mg by mouth daily.   Yes [provider]  levETIRAcetam (KEPPRA) 100 MG/ML solution Take 5 mLs by mouth every 12 (twelve) hours. 12/14/17  Yes [provider]  levothyroxine (SYNTHROID) 88 MCG tablet Take 88 mcg by mouth daily before breakfast.    Yes [provider]  metoprolol succinate (TOPROL-XL) 25 MG 24 hr tablet Take 25 mg by mouth daily.   Yes  [provider]  pravastatin (PRAVACHOL) 80 MG tablet Take 80 mg by mouth at bedtime.    Yes [provider]  cefdinir (OMNICEF) 300 MG capsule Take 1 capsule (300 mg total) by mouth 2 (two) times daily. 02/04/19   Brailon Don, Linden Dolin, PA-C  potassium chloride (K-DUR) 10 MEQ tablet Take 1 tablet (10 mEq total) by mouth daily. 02/04/19   Versie Starks, PA-C    Allergies Oxycodone  History reviewed. No pertinent family history.  Social History Social History   Tobacco Use   Smoking status: Never Smoker   Smokeless tobacco: Former Network engineer Use Topics   Alcohol use: Not Currently   Drug use: Not on file    Review of Systems  Constitutional: No fever/chills, AMS Eyes: No visual changes. ENT: No sore throat. Respiratory: Denies cough Genitourinary: Negative for dysuria. Musculoskeletal: Negative for back pain. Skin: Negative for rash.    ____________________________________________   PHYSICAL EXAM:  VITAL SIGNS: ED Triage Vitals [02/04/19 1528]  Enc Vitals Group     BP 134/75     Pulse Rate 69     Resp 14     Temp 98.4 F (36.9 C)     Temp Source Oral     SpO2 99 %     Weight 105 lb (47.6 kg)     Height 5\' 4"  (1.626 m)  Head Circumference      Peak Flow      Pain Score 0     Pain Loc      Pain Edu?      Excl. in [place]?     Constitutional: Alert and oriented. Well appearing and in no acute distress. Eyes: Conjunctivae are normal.  Head: Atraumatic. Nose: No congestion/rhinnorhea. Mouth/Throat: Mucous membranes are moist.   Neck:  supple no lymphadenopathy noted Cardiovascular: Normal rate, regular rhythm. Heart sounds are normal Respiratory: Normal respiratory effort.  No retractions, lungs c t a  Abd: soft nontender bs normal all 4 quad GU: deferred Musculoskeletal: FROM all extremities, warm and well perfused, for patient's baseline Neurologic: Speech is difficult due to her previous stroke  skin:  Skin is warm, dry and  intact. No rash noted. Psychiatric: Mood and affect are normal. Speech and behavior are at her baseline.  ____________________________________________   LABS (all labs ordered are listed, but only abnormal results are displayed)  Labs Reviewed  COMPREHENSIVE METABOLIC PANEL - Abnormal; Notable for the following components:      Result Value   Potassium 3.2 (*)    Glucose, Bld 101 (*)    Creatinine, Ser 1.15 (*)    GFR calc non Af Amer 42 (*)    GFR calc Af Amer 49 (*)    All other components within normal limits  URINALYSIS, COMPLETE (UACMP) WITH MICROSCOPIC - Abnormal; Notable for the following components:   Color, Urine YELLOW (*)    APPearance HAZY (*)    Leukocytes,Ua SMALL (*)    All other components within normal limits  CBC WITH DIFFERENTIAL/PLATELET  TROPONIN I  LACTIC ACID, PLASMA  LACTIC ACID, PLASMA   ____________________________________________   ____________________________________________  RADIOLOGY  CT of the head is negative for any acute abnormality Chest x-ray is normal  ____________________________________________   PROCEDURES  Procedure(s) performed: Rocephin 1 g IV  Procedures    ____________________________________________   INITIAL IMPRESSION / ASSESSMENT AND PLAN / ED COURSE  Pertinent labs & imaging results that were available during my care of the patient were reviewed by me and considered in my medical decision making (see chart for details).   Patient is a 85 year old female presents emergency department with AMS.  Family member states the last time she was like that she had a UTI.  Physical exam shows that patient is at baseline.  She is afebrile.  Remainder of exam is unremarkable  UA has small amount of leuks with 11-20 WBCs, lactic acid is normal, comprehensive metabolic panel shows a decreased potassium at 3.2, troponin is normal, CBC is normal  CT of the head is negative for any acute intracranial abnormality Chest x-ray  is normal  Called her son to discuss his signs and symptoms.  He agrees to our treatment plan.  He will come and pick her up and feels comfortable taking her back home.    Linda Petersen was evaluated in Emergency Department on 02/04/2019 for the symptoms described in the history of present illness. She was evaluated in the context of the global COVID-19 pandemic, which necessitated consideration that the patient might be at risk for infection with the SARS-CoV-2 virus that causes COVID-19. Institutional protocols and algorithms that pertain to the evaluation of patients at risk for COVID-19 are in a state of rapid change based on information released by regulatory bodies including the CDC and federal and state organizations. These policies and algorithms were followed during the patient's care in the ED.  As part of my medical decision making, I reviewed the following data within the [place] History obtained from family, Nursing notes reviewed and incorporated, Labs reviewed the above, EKG interpreted atrial fibrillation, Old chart reviewed, Radiograph reviewed CT of the head is negative, chest x-ray is normal, Notes from prior ED visits and [place] Controlled Substance Database  ____________________________________________   FINAL CLINICAL IMPRESSION(S) / ED DIAGNOSES  Final diagnoses:  Urinary tract infection without hematuria, site unspecified  Hypokalemia      NEW MEDICATIONS STARTED DURING THIS VISIT:  New Prescriptions   CEFDINIR (OMNICEF) 300 MG CAPSULE    Take 1 capsule (300 mg total) by mouth 2 (two) times daily.   POTASSIUM CHLORIDE (K-DUR) 10 MEQ TABLET    Take 1 tablet (10 mEq total) by mouth daily.     Note:  This document was prepared using Dragon voice recognition software and may include unintentional dictation errors.    Versie Starks, PA-C 02/04/19 [place], [place], MD 02/04/19 2025

## 2019-03-11 ENCOUNTER — Emergency Department: Payer: Medicare (Managed Care)

## 2019-03-11 ENCOUNTER — Other Ambulatory Visit: Payer: Self-pay

## 2019-03-11 ENCOUNTER — Emergency Department
Admission: EM | Admit: 2019-03-11 | Discharge: 2019-03-12 | Disposition: A | Discharge status: Other Acute Inpatient Hospital | Payer: Medicare (Managed Care) | Attending: Emergency Medicine | Admitting: Emergency Medicine

## 2019-03-11 ENCOUNTER — Encounter: Payer: Self-pay | Admitting: Emergency Medicine

## 2019-03-11 DIAGNOSIS — I6200 Nontraumatic subdural hemorrhage, unspecified: Secondary | ICD-10-CM | POA: Diagnosis not present

## 2019-03-11 DIAGNOSIS — Z20828 Contact with and (suspected) exposure to other viral communicable diseases: Secondary | ICD-10-CM | POA: Insufficient documentation

## 2019-03-11 DIAGNOSIS — E039 Hypothyroidism, unspecified: Secondary | ICD-10-CM | POA: Diagnosis not present

## 2019-03-11 DIAGNOSIS — I6203 Nontraumatic chronic subdural hemorrhage: Secondary | ICD-10-CM

## 2019-03-11 DIAGNOSIS — Z79899 Other long term (current) drug therapy: Secondary | ICD-10-CM | POA: Diagnosis not present

## 2019-03-11 DIAGNOSIS — I6201 Nontraumatic acute subdural hemorrhage: Secondary | ICD-10-CM

## 2019-03-11 DIAGNOSIS — I130 Hypertensive heart and chronic kidney disease with heart failure and stage 1 through stage 4 chronic kidney disease, or unspecified chronic kidney disease: Secondary | ICD-10-CM | POA: Insufficient documentation

## 2019-03-11 DIAGNOSIS — G40901 Epilepsy, unspecified, not intractable, with status epilepticus: Secondary | ICD-10-CM | POA: Diagnosis not present

## 2019-03-11 DIAGNOSIS — R569 Unspecified convulsions: Secondary | ICD-10-CM | POA: Diagnosis present

## 2019-03-11 DIAGNOSIS — N183 Chronic kidney disease, stage 3 (moderate): Secondary | ICD-10-CM | POA: Diagnosis not present

## 2019-03-11 DIAGNOSIS — Z7901 Long term (current) use of anticoagulants: Secondary | ICD-10-CM | POA: Diagnosis not present

## 2019-03-11 DIAGNOSIS — Z8673 Personal history of transient ischemic attack (TIA), and cerebral infarction without residual deficits: Secondary | ICD-10-CM | POA: Diagnosis not present

## 2019-03-11 DIAGNOSIS — I509 Heart failure, unspecified: Secondary | ICD-10-CM | POA: Insufficient documentation

## 2019-03-11 HISTORY — DX: Unspecified convulsions: R56.9

## 2019-03-11 LAB — CBC
HCT: 32.2 % — ABNORMAL LOW (ref 36.0–46.0)
Hemoglobin: 10.7 g/dL — ABNORMAL LOW (ref 12.0–15.0)
MCH: 30.9 pg (ref 26.0–34.0)
MCHC: 33.2 g/dL (ref 30.0–36.0)
MCV: 93.1 fL (ref 80.0–100.0)
Platelets: 170 10*3/uL (ref 150–400)
RBC: 3.46 MIL/uL — ABNORMAL LOW (ref 3.87–5.11)
RDW: 15.2 % (ref 11.5–15.5)
WBC: 4.7 10*3/uL (ref 4.0–10.5)
nRBC: 0 % (ref 0.0–0.2)

## 2019-03-11 LAB — COMPREHENSIVE METABOLIC PANEL
ALT: 16 U/L (ref 0–44)
AST: 34 U/L (ref 15–41)
Albumin: 3.9 g/dL (ref 3.5–5.0)
Alkaline Phosphatase: 76 U/L (ref 38–126)
Anion gap: 16 — ABNORMAL HIGH (ref 5–15)
BUN: 23 mg/dL (ref 8–23)
CO2: 22 mmol/L (ref 22–32)
Calcium: 8.8 mg/dL — ABNORMAL LOW (ref 8.9–10.3)
Chloride: 103 mmol/L (ref 98–111)
Creatinine, Ser: 1.2 mg/dL — ABNORMAL HIGH (ref 0.44–1.00)
GFR calc Af Amer: 46 mL/min — ABNORMAL LOW (ref >60)
GFR calc non Af Amer: 40 mL/min — ABNORMAL LOW (ref >60)
Glucose, Bld: 129 mg/dL — ABNORMAL HIGH (ref 70–99)
Potassium: 3.1 mmol/L — ABNORMAL LOW (ref 3.5–5.1)
Sodium: 141 mmol/L (ref 135–145)
Total Bilirubin: 0.8 mg/dL (ref 0.3–1.2)
Total Protein: 7.6 g/dL (ref 6.5–8.1)

## 2019-03-11 LAB — SARS CORONAVIRUS 2 BY RT PCR (HOSPITAL ORDER, PERFORMED IN ~~LOC~~ HOSPITAL LAB): SARS Coronavirus 2: NEGATIVE

## 2019-03-11 MED ORDER — LORAZEPAM 2 MG/ML IJ SOLN
1.0000 mg | Freq: Once | INTRAMUSCULAR | Status: AC
  Administered 2019-03-11: 1 mg via INTRAVENOUS
  Filled 2019-03-11: qty 1

## 2019-03-11 MED ORDER — PROTHROMBIN COMPLEX CONC HUMAN 500 UNITS IV KIT
2000.0000 [IU] | PACK | Status: AC
  Administered 2019-03-11: 2000 [IU] via INTRAVENOUS
  Filled 2019-03-11: qty 2000

## 2019-03-11 MED ORDER — POTASSIUM CHLORIDE 10 MEQ/100ML IV SOLN
10.0000 meq | Freq: Once | INTRAVENOUS | Status: DC
  Filled 2019-03-11: qty 100

## 2019-03-11 MED ORDER — SODIUM CHLORIDE 0.9 % IV SOLN
Freq: Once | INTRAVENOUS | Status: AC
  Administered 2019-03-11: via INTRAVENOUS

## 2019-03-11 MED ORDER — MAGNESIUM SULFATE 2 GM/50ML IV SOLN
2.0000 g | Freq: Once | INTRAVENOUS | Status: AC
  Administered 2019-03-11: 2 g via INTRAVENOUS
  Filled 2019-03-11: qty 50

## 2019-03-11 MED ORDER — SODIUM CHLORIDE 0.9 % IV SOLN
20.0000 mg/kg | Freq: Once | INTRAVENOUS | Status: AC
  Administered 2019-03-11: 998 mg via INTRAVENOUS
  Filled 2019-03-11: qty 19.96

## 2019-03-11 MED ORDER — POTASSIUM CHLORIDE 10 MEQ/100ML IV SOLN
10.0000 meq | INTRAVENOUS | Status: DC
  Administered 2019-03-11: 10 meq via INTRAVENOUS
  Filled 2019-03-11 (×4): qty 100

## 2019-03-11 MED ORDER — THIAMINE HCL 100 MG/ML IJ SOLN
50.0000 mg | Freq: Once | INTRAMUSCULAR | Status: AC
  Administered 2019-03-11: 50 mg via INTRAVENOUS
  Filled 2019-03-11: qty 2

## 2019-03-11 MED ORDER — LEVETIRACETAM IN NACL 1000 MG/100ML IV SOLN
1000.0000 mg | Freq: Once | INTRAVENOUS | Status: AC
  Administered 2019-03-11: 1000 mg via INTRAVENOUS
  Filled 2019-03-11 (×2): qty 100

## 2019-03-11 NOTE — ED Triage Notes (Signed)
Pt comes EMS from home with seizure. Versed given by ems. Hx pacemaker. Per family on seizure meds but none on the med list.

## 2019-03-11 NOTE — Consult Note (Addendum)
[facility] TeleNeurology Consult Services  Stat Consult  Date of Service:   03/11/2019 19:17:00  Impression:     .  Seizure     .  Subclinical status epilepticus Acute on chronic right SDH Anticoagulation therapy  Depressed level of alertness, lip twitching. Acute on chronic SDH. Subclinical status, provoked by acute on chronic SDH. Coagulopathy.If clinical decline STAT CT head and if worsening SDH STAT coagulopathy reversal with 4-factor Chaves.  CT HEAD: Reviewed Acute on chronic right SDH  Metrics: [employer] Notification Time: 03/11/2019 19:14:29 Stamp Time: 03/11/2019 19:17:00 Callback Response Time: 03/11/2019 19:18:16 Video Start Time: 03/11/2019 19:22:00 Video End Time: 03/11/2019 19:44:12  Our recommendations are outlined below.  Recommendations:     .  Load With Keppra 1000 mg Now     .  Start Keppra 1000 mg BID     .  Hold Apixaban     .  IV Thiamine 500mg      .  Correct electrolyte abnormalities including Ca, Mg, Phos     .  Consult neurosurgery     .  STAT EEG, continuous if available     .  ICU level of care     .  If no response to Keppra load, then proceed with Fosphenytoin load   Therapies:     .  Physical Therapy, Occupational Therapy, Speech Therapy Assessment When Applicable  Other Workup:     .  Infectious/metabolic workup per primary team     .  Check an ammonia level     .  Check B12 level     .  Check TSH     .  Check Urinalysis  Disposition: Neurology Follow Up Recommended  Sign Out:     .  Discussed with Emergency Department Provider  ----------------------------------------------------------------------------------------------------  Chief Complaint: shaking  History of Present Illness: Patient is a 85 year old Female, handedness unknown. No tobacco or EtOH. Left hemiparesis from prior stroke. Patient unable to provide history, son provides information.  Prior stroke with left sided weakness, prior  seizures on Keppra 500 BID, last seizure late May 2020 in the setting of UTI. Since 3PM today head shaking and an episode of labored breathing and unresponsiveness. EMS gave her versed and then Ativan in ER. Fine until this morning. On Apixaban 2.5 BID  Examination: BP(112/89), Blood Glucose(129) Forced right head and gaze deviation Does not follow commands Lip twitching, episodic Mute  1A: Level of Consciousness - Requires repeated stimulation to arouse + 2 1B: Ask Month and Age - Aphasic + 2 1C: Blink Eyes & Squeeze Hands - Performs 0 Tasks + 2 2: Test Horizontal Extraocular Movements - Forced Gaze Palsy: Cannot Be Overcome + 2 3: Test Visual Fields - Complete Hemianopia + 2 4: Test Facial Palsy (Use Grimace if Obtunded) - Normal symmetry + 0 5A: Test Left Arm Motor Drift - No Movement + 4 5B: Test Right Arm Motor Drift - Drift, but doesn't hit bed + 1 6A: Test Left Leg Motor Drift - No Movement + 4 6B: Test Right Leg Motor Drift - Drift, hits bed + 2 7: Test Limb Ataxia (FNF/Heel-Shin) - No Ataxia + 0 8: Test Sensation - Normal; No sensory loss + 0 9: Test Language/Aphasia - Mute/Global Aphasia: No Usable Speech/Auditory Comprehension + 3 10: Test Dysarthria - Mute/Anarthric + 2 11: Test Extinction/Inattention - Profound hemi-inattention (ex: does not recognize own hand) + 2  NIHSS Score: 28  Patient/Family was informed the Neurology Consult would happen via  TeleHealth consult by way of interactive audio and video telecommunications and consented to receiving care in this manner.  Due to the immediate potential for life-threatening deterioration due to underlying acute neurologic illness, I spent 35 minutes providing critical care. This time includes time for face to face visit via telemedicine, review of medical records, imaging studies and discussion of findings with providers, the patient and/or family.   Dr Lita Mains   [facility] [phone]   Case  [id number]

## 2019-03-11 NOTE — ED Notes (Signed)
Pt's son is on his way - he is poa

## 2019-03-11 NOTE — ED Provider Notes (Signed)
[facility] Emergency Department Provider Note  ____________________________________________  Time seen: Approximately 6:29 PM  I have reviewed the triage vital signs and the nursing notes.   HISTORY  Chief Complaint Seizures    HPI Linda Petersen is a 85 y.o. female with a history of seizures, CHF, hypertension, hyperlipidemia, chronic kidney disease stage III, hypothyroidism, prior history of right MCA CVA via EMS presents to the emergency department with concern for seizure that happened approximately 2 hours ago.  Patient was assisted by her son to the restroom and returned to her wheelchair.  He initially noticed head-bobbing that was new for patient.  Son reports that he noticed rhythmic movement of the left upper extremity and left lower extremity that was new for patient. Patient's eyes rolled back in the head and son describes changes in breathing.  Patient son denies knowledge of new falls or traumas.  No other alleviating measures have been attempted.  Patient is currently on Keppra and Eliquis.       Past Medical History:  Diagnosis Date  . Anemia   . Cancer [facility])    Colon  . Coronary artery disease   . Hypothyroidism   . Presence of permanent cardiac pacemaker   . Seizures ([place])   . Stroke [facility])    left side weakness    There are no active problems to display for this patient.   History reviewed. No pertinent surgical history.  Prior to Admission medications   Medication Sig Start Date End Date Taking? Authorizing Provider  acetaminophen (TYLENOL) 500 MG tablet Take 500 mg by mouth every 4 (four) hours as needed for mild pain or moderate pain.   Yes [provider]  apixaban (ELIQUIS) 2.5 MG TABS tablet Take 2.5 mg by mouth every 12 (twelve) hours.   Yes [provider]  famotidine (PEPCID) 20 MG tablet Take 20 mg by mouth 2 (two) times daily.   Yes [provider]  ferrous sulfate 325 (65 FE) MG tablet Take 325  mg by mouth every Monday, Wednesday, and Friday.    Yes [provider]  furosemide (LASIX) 40 MG tablet Take 40 mg by mouth daily.   Yes [provider]  levETIRAcetam (KEPPRA) 100 MG/ML solution Take 5 mLs by mouth every 12 (twelve) hours. 12/14/17  Yes [provider]  levothyroxine (SYNTHROID) 88 MCG tablet Take 88 mcg by mouth daily before breakfast.    Yes [provider]  metoprolol succinate (TOPROL-XL) 25 MG 24 hr tablet Take 25 mg by mouth daily.   Yes [provider]  potassium chloride (K-DUR) 10 MEQ tablet Take 1 tablet (10 mEq total) by mouth daily. 02/04/19  Yes Fisher, Linden Dolin, PA-C  pravastatin (PRAVACHOL) 80 MG tablet Take 80 mg by mouth at bedtime.    Yes [provider]  cefdinir (OMNICEF) 300 MG capsule Take 1 capsule (300 mg total) by mouth 2 (two) times daily. Patient not taking: Reported on 03/11/2019 02/04/19   Versie Starks, PA-C    Allergies Oxycodone  History reviewed. No pertinent family history.  Social History Social History   Tobacco Use  . Smoking status: Never Smoker  . Smokeless tobacco: Former Network engineer Use Topics  . Alcohol use: Not Currently  . Drug use: Not on file     Review of Systems  Constitutional: No fever/chills Cardiovascular: no chest pain. Respiratory: no cough. No SOB. Gastrointestinal: No vomiting.  No diarrhea.  No constipation. hematuria Musculoskeletal: Patient has left  arm pain.  Skin: Negative for rash, abrasions, lacerations, ecchymosis.    ____________________________________________   PHYSICAL EXAM:  VITAL SIGNS: ED Triage Vitals [03/11/19 1750]  Enc Vitals Group     BP 115/82     Pulse Rate 72     Resp 20     Temp 98.1 F (36.7 C)     Temp Source Oral     SpO2 99 %     Weight 110 lb (49.9 kg)     Height 5\' 2"  (1.575 m)     Head Circumference      Peak Flow      Pain Score 0     Pain Loc      Pain Edu?      Excl. in [place]?       Constitutional: Alert and oriented. Well appearing and in no acute distress. Eyes: Conjunctivae are normal. PERRL. EOMI. Head: Atraumatic. ENT:      Mouth/Throat: Mucous membranes are moist.  Neck: FROM.  Cardiovascular: Normal rate, regular rhythm. Normal S1 and S2.  Good peripheral circulation. Respiratory: Normal respiratory effort without tachypnea or retractions. Lungs CTAB. Good air entry to the bases with no decreased or absent breath sounds. Gastrointestinal: Bowel sounds 4 quadrants. Soft and nontender to palpation. No guarding or rigidity. No palpable masses. No distention. No CVA tenderness. Musculoskeletal: Patient has chronic left upper and left lower extremity weakness. Neurologic:  Skin:  Skin is warm, dry and intact. No rash noted. Psychiatric: Mood and affect are normal. Speech and behavior are normal. Patient exhibits appropriate insight and judgement.   ____________________________________________   LABS (all labs ordered are listed, but only abnormal results are displayed)  Labs Reviewed  COMPREHENSIVE METABOLIC PANEL - Abnormal; Notable for the following components:      Result Value   Potassium 3.1 (*)    Glucose, Bld 129 (*)    Creatinine, Ser 1.20 (*)    Calcium 8.8 (*)    GFR calc non Af Amer 40 (*)    GFR calc Af Amer 46 (*)    Anion gap 16 (*)    All other components within normal limits  CBC - Abnormal; Notable for the following components:   RBC 3.46 (*)    Hemoglobin 10.7 (*)    HCT 32.2 (*)    All other components within normal limits  LEVETIRACETAM LEVEL  URINALYSIS, COMPLETE (UACMP) WITH MICROSCOPIC  PROTIME-INR  APTT   ____________________________________________  EKG   ____________________________________________  RADIOLOGY I personally viewed and evaluated these images as part of my medical decision making, as well as reviewing the written report by the radiologist.    Ct Head Wo Contrast  Result Date:  03/11/2019 CLINICAL DATA:  Seizure EXAM: CT HEAD WITHOUT CONTRAST TECHNIQUE: Contiguous axial images were obtained from the base of the skull through the vertex without intravenous contrast. COMPARISON:  02/04/2019 FINDINGS: Brain: Old right MCA infarct. There Is extra-axial fluid collection of variable density overlying the right frontal and parietal lobes measuring 15 mm in thickness compatible with subdural hematoma. This is predominantly low-density, likely chronic but new since prior study. No hydrocephalus, mass effect or midline shift. Vascular: No hyperdense vessel or unexpected calcification. Skull: No acute calvarial abnormality. Sinuses/Orbits: Visualized paranasal sinuses and mastoids clear. Orbital soft tissues unremarkable. Other: None IMPRESSION: Mixed density 15 mm thick right frontoparietal subdural hematoma, predominantly low-density suggesting subacute to chronic. No significant mass effect or midline shift. These results were called by telephone at the time of interpretation  on 03/11/2019 at 7:27 pm to Dr. Duffy Bruce , who verbally acknowledged these results. Electronically Signed   By: Rolm Baptise M.D.   On: 03/11/2019 19:30   Dg Chest Portable 1 View  Result Date: 03/11/2019 CLINICAL DATA:  Seizure-like activity.  Evaluate for aspiration EXAM: PORTABLE CHEST 1 VIEW COMPARISON:  02/04/2019 FINDINGS: Prior median sternotomy and valve replacement. Cardiomegaly. Pacer wires are unchanged. Aortic atherosclerosis. No overt edema, confluent airspace opacities or effusions. No acute bony abnormality. IMPRESSION: Cardiomegaly.  No active disease. Electronically Signed   By: Rolm Baptise M.D.   On: 03/11/2019 19:49    ____________________________________________    PROCEDURES  Procedure(s) performed:    Procedures    Medications  levETIRAcetam (KEPPRA) IVPB 1000 mg/100 mL premix (has no administration in time range)  thiamine (B-1) injection 50 mg (has no administration in time  range)  fosPHENYtoin (CEREBYX) 998 mg PE in sodium chloride 0.9 % 50 mL IVPB (has no administration in time range)  prothrombin complex conc human (KCENTRA) IVPB 2,000 Units (has no administration in time range)  potassium chloride 10 mEq in 100 mL IVPB (has no administration in time range)  magnesium sulfate IVPB 2 g 50 mL (2 g Intravenous New Bag/Given 03/11/19 2104)  0.9 %  sodium chloride infusion (has no administration in time range)  LORazepam (ATIVAN) injection 1 mg (1 mg Intravenous Given 03/11/19 1914)     ____________________________________________   INITIAL IMPRESSION / ASSESSMENT AND PLAN / ED COURSE  Pertinent labs & imaging results that were available during my care of the patient were reviewed by me and considered in my medical decision making (see chart for details).  Review of the [place] CSRS was performed in accordance of the [place] prior to dispensing any controlled drugs.           Assessment and Plan:  Acute on chronic subdural hematoma Status epilepticus 85 year old female with a history of prior MCA CVA and seizures on Eliquis and Keppra, presents to the emergency department in complex partial status epilepticus  Son describes a prodrome of head bobbing and then noticed rhythmic movement of the left upper and left lower extremity.  Patient has had a change in mental status and will no longer converse as she normally does.  Son described approximately 2 minutes where patient's eyes rolled back in her head and she experienced breathing changes.  Patient arrived to the emergency department by EMS and received Versed in route.  On physical exam vital signs are stable.  Patient has rhythmic movement of the left upper and left lower extremity.   Basic labs were obtained in the emergency department.  CBC and CMP were largely reassuring. CT head was concerning for acute on chronic subdural hematoma. Keppra and Ativan were given in the emergency department. Plan is to reverse  Eliquis. Potassium and thiamine were given in the emergency department per neurology.   Attending Dr. Ellender Hose contacted Neurosurgery regarding subdural hematoma who recommended addition of fosphenytoin and agreed with reversal of Eliquis.  Patient was accepted for transfer to ICU at [facility]. Attending Dr. Ellender Hose mediated transfer.      ____________________________________________  FINAL CLINICAL IMPRESSION(S) / ED DIAGNOSES  Final diagnoses:  Acute on chronic intracranial subdural hematoma (HCC)  Status epilepticus ([place])      NEW MEDICATIONS STARTED DURING THIS VISIT:  ED Discharge Orders    None          This chart was dictated using voice recognition software/Dragon. Despite best efforts  to proofread, errors can occur which can change the meaning. Any change was purely unintentional.    Karren Cobble 03/11/19 2126    Duffy Bruce, MD 03/14/19 2002

## 2019-03-11 NOTE — Progress Notes (Signed)
  NEUROSURGERY PROGRESS NOTE   Received call from Dr Ellender Hose, EDP at [facility] regarding patient. 85 year old female with history CHF, HTN, hyperlipidemia, CKD stage III, hypothyroidism, CVA, prior seizures (on Keppra) with last seizure May 2020 who presented to ED over concern of possible seizure. She has been evaluated by tele-neurology for decreased responsiveness and episodic lip twitching. Appears to be in subclinical status epilepticus which is being managed by Neuro. She underwent head CT which revealed 89mm right frontoparietal SDH without significant mass effect or MLS. Patient is going to be transferred to [facility] for [place] consultation and management of her seizures. She is on Eliquis. This has been reversed by ED. No immediate need for NS intervention. Please call when patient arrives.  - repeat head CT tomorrow am for monitoring  - q 1 hour neuro checks. Report any change - Will need to discuss with family GOC should SDH worsen  Ferne Reus, PA-C [facility]

## 2019-03-11 NOTE — ED Notes (Signed)
Pt has hx of stroke on left and is unable to move her arm or leg. When asked to raise her left arm she will raise her right, and does not acknowledge her left side. Pt came in from home with seizure and has twitching on her left side where her pacemaker is. Family is unsure of make and model to check reading.

## 2019-03-12 ENCOUNTER — Inpatient Hospital Stay (HOSPITAL_COMMUNITY): Payer: Medicare (Managed Care)

## 2019-03-12 ENCOUNTER — Inpatient Hospital Stay (HOSPITAL_COMMUNITY)
Admission: AD | Admit: 2019-03-12 | Discharge: 2019-03-16 | DRG: 065 | Disposition: A | Discharge status: Home Health | Payer: Medicare (Managed Care) | Source: Other Acute Inpatient Hospital | Attending: Internal Medicine | Admitting: Internal Medicine

## 2019-03-12 DIAGNOSIS — R1310 Dysphagia, unspecified: Secondary | ICD-10-CM | POA: Diagnosis present

## 2019-03-12 DIAGNOSIS — Z79890 Hormone replacement therapy (postmenopausal): Secondary | ICD-10-CM

## 2019-03-12 DIAGNOSIS — Z7901 Long term (current) use of anticoagulants: Secondary | ICD-10-CM | POA: Diagnosis not present

## 2019-03-12 DIAGNOSIS — R54 Age-related physical debility: Secondary | ICD-10-CM | POA: Diagnosis present

## 2019-03-12 DIAGNOSIS — I482 Chronic atrial fibrillation, unspecified: Secondary | ICD-10-CM | POA: Diagnosis present

## 2019-03-12 DIAGNOSIS — R64 Cachexia: Secondary | ICD-10-CM | POA: Diagnosis present

## 2019-03-12 DIAGNOSIS — M25569 Pain in unspecified knee: Secondary | ICD-10-CM

## 2019-03-12 DIAGNOSIS — M1711 Unilateral primary osteoarthritis, right knee: Secondary | ICD-10-CM | POA: Diagnosis present

## 2019-03-12 DIAGNOSIS — Z87891 Personal history of nicotine dependence: Secondary | ICD-10-CM | POA: Diagnosis not present

## 2019-03-12 DIAGNOSIS — Z20828 Contact with and (suspected) exposure to other viral communicable diseases: Secondary | ICD-10-CM | POA: Diagnosis present

## 2019-03-12 DIAGNOSIS — I69354 Hemiplegia and hemiparesis following cerebral infarction affecting left non-dominant side: Secondary | ICD-10-CM | POA: Diagnosis not present

## 2019-03-12 DIAGNOSIS — I129 Hypertensive chronic kidney disease with stage 1 through stage 4 chronic kidney disease, or unspecified chronic kidney disease: Secondary | ICD-10-CM | POA: Diagnosis present

## 2019-03-12 DIAGNOSIS — S065X9A Traumatic subdural hemorrhage with loss of consciousness of unspecified duration, initial encounter: Secondary | ICD-10-CM

## 2019-03-12 DIAGNOSIS — K219 Gastro-esophageal reflux disease without esophagitis: Secondary | ICD-10-CM | POA: Diagnosis present

## 2019-03-12 DIAGNOSIS — D631 Anemia in chronic kidney disease: Secondary | ICD-10-CM | POA: Diagnosis present

## 2019-03-12 DIAGNOSIS — G40909 Epilepsy, unspecified, not intractable, without status epilepticus: Secondary | ICD-10-CM | POA: Diagnosis present

## 2019-03-12 DIAGNOSIS — Z885 Allergy status to narcotic agent status: Secondary | ICD-10-CM

## 2019-03-12 DIAGNOSIS — E039 Hypothyroidism, unspecified: Secondary | ICD-10-CM | POA: Diagnosis present

## 2019-03-12 DIAGNOSIS — Z515 Encounter for palliative care: Secondary | ICD-10-CM

## 2019-03-12 DIAGNOSIS — G40901 Epilepsy, unspecified, not intractable, with status epilepticus: Secondary | ICD-10-CM | POA: Diagnosis present

## 2019-03-12 DIAGNOSIS — Z79899 Other long term (current) drug therapy: Secondary | ICD-10-CM

## 2019-03-12 DIAGNOSIS — J9600 Acute respiratory failure, unspecified whether with hypoxia or hypercapnia: Secondary | ICD-10-CM | POA: Diagnosis not present

## 2019-03-12 DIAGNOSIS — Z993 Dependence on wheelchair: Secondary | ICD-10-CM

## 2019-03-12 DIAGNOSIS — R569 Unspecified convulsions: Secondary | ICD-10-CM

## 2019-03-12 DIAGNOSIS — N183 Chronic kidney disease, stage 3 (moderate): Secondary | ICD-10-CM | POA: Diagnosis present

## 2019-03-12 DIAGNOSIS — Z7189 Other specified counseling: Secondary | ICD-10-CM

## 2019-03-12 DIAGNOSIS — E876 Hypokalemia: Secondary | ICD-10-CM | POA: Diagnosis present

## 2019-03-12 DIAGNOSIS — I2510 Atherosclerotic heart disease of native coronary artery without angina pectoris: Secondary | ICD-10-CM | POA: Diagnosis present

## 2019-03-12 DIAGNOSIS — I6203 Nontraumatic chronic subdural hemorrhage: Secondary | ICD-10-CM | POA: Diagnosis present

## 2019-03-12 DIAGNOSIS — M25561 Pain in right knee: Secondary | ICD-10-CM | POA: Diagnosis not present

## 2019-03-12 DIAGNOSIS — E785 Hyperlipidemia, unspecified: Secondary | ICD-10-CM | POA: Diagnosis present

## 2019-03-12 DIAGNOSIS — Z66 Do not resuscitate: Secondary | ICD-10-CM | POA: Diagnosis present

## 2019-03-12 DIAGNOSIS — Z6820 Body mass index (BMI) 20.0-20.9, adult: Secondary | ICD-10-CM

## 2019-03-12 DIAGNOSIS — Z85038 Personal history of other malignant neoplasm of large intestine: Secondary | ICD-10-CM

## 2019-03-12 DIAGNOSIS — G8929 Other chronic pain: Secondary | ICD-10-CM | POA: Diagnosis not present

## 2019-03-12 DIAGNOSIS — Z7401 Bed confinement status: Secondary | ICD-10-CM | POA: Diagnosis not present

## 2019-03-12 DIAGNOSIS — Z950 Presence of cardiac pacemaker: Secondary | ICD-10-CM

## 2019-03-12 DIAGNOSIS — J9601 Acute respiratory failure with hypoxia: Secondary | ICD-10-CM | POA: Diagnosis not present

## 2019-03-12 LAB — URINALYSIS, ROUTINE W REFLEX MICROSCOPIC
Bacteria, UA: NONE SEEN
Bilirubin Urine: NEGATIVE
Glucose, UA: NEGATIVE mg/dL
Hgb urine dipstick: NEGATIVE
Ketones, ur: NEGATIVE mg/dL
Leukocytes,Ua: NEGATIVE
Nitrite: NEGATIVE
Protein, ur: 30 mg/dL — AB
Specific Gravity, Urine: 1.009 (ref 1.005–1.030)
pH: 7 (ref 5.0–8.0)

## 2019-03-12 LAB — CBC
HCT: 31.8 % — ABNORMAL LOW (ref 36.0–46.0)
Hemoglobin: 10.8 g/dL — ABNORMAL LOW (ref 12.0–15.0)
MCH: 31.1 pg (ref 26.0–34.0)
MCHC: 34 g/dL (ref 30.0–36.0)
MCV: 91.6 fL (ref 80.0–100.0)
Platelets: 134 10*3/uL — ABNORMAL LOW (ref 150–400)
RBC: 3.47 MIL/uL — ABNORMAL LOW (ref 3.87–5.11)
RDW: 15.1 % (ref 11.5–15.5)
WBC: 4.4 10*3/uL (ref 4.0–10.5)
nRBC: 0 % (ref 0.0–0.2)

## 2019-03-12 LAB — BASIC METABOLIC PANEL
Anion gap: 10 (ref 5–15)
BUN: 16 mg/dL (ref 8–23)
CO2: 25 mmol/L (ref 22–32)
Calcium: 8.4 mg/dL — ABNORMAL LOW (ref 8.9–10.3)
Chloride: 103 mmol/L (ref 98–111)
Creatinine, Ser: 1.06 mg/dL — ABNORMAL HIGH (ref 0.44–1.00)
GFR calc Af Amer: 54 mL/min — ABNORMAL LOW (ref >60)
GFR calc non Af Amer: 47 mL/min — ABNORMAL LOW (ref >60)
Glucose, Bld: 130 mg/dL — ABNORMAL HIGH (ref 70–99)
Potassium: 5.4 mmol/L — ABNORMAL HIGH (ref 3.5–5.1)
Sodium: 138 mmol/L (ref 135–145)

## 2019-03-12 LAB — PROTIME-INR
INR: 1.1 (ref 0.8–1.2)
Prothrombin Time: 13.8 seconds (ref 11.4–15.2)

## 2019-03-12 LAB — GLUCOSE, CAPILLARY: Glucose-Capillary: 133 mg/dL — ABNORMAL HIGH (ref 70–99)

## 2019-03-12 LAB — MAGNESIUM: Magnesium: 2.1 mg/dL (ref 1.7–2.4)

## 2019-03-12 LAB — PHOSPHORUS: Phosphorus: 2.5 mg/dL (ref 2.5–4.6)

## 2019-03-12 LAB — PROCALCITONIN: Procalcitonin: <0.1 ng/mL

## 2019-03-12 LAB — MRSA PCR SCREENING: MRSA by PCR: NEGATIVE

## 2019-03-12 LAB — APTT: aPTT: 26 seconds (ref 24–36)

## 2019-03-12 MED ORDER — SODIUM CHLORIDE 0.9 % IV SOLN: INTRAVENOUS | Status: DC

## 2019-03-12 MED ORDER — LEVOTHYROXINE SODIUM 100 MCG/5ML IV SOLN
44.0000 ug | Freq: Every day | INTRAVENOUS | Status: DC
  Administered 2019-03-12 – 2019-03-13 (×2): 44 ug via INTRAVENOUS
  Filled 2019-03-12 (×2): qty 5

## 2019-03-12 MED ORDER — MORPHINE SULFATE (PF) 4 MG/ML IV SOLN
4.0000 mg | Freq: Once | INTRAVENOUS | Status: AC
  Administered 2019-03-12: 4 mg via INTRAVENOUS
  Filled 2019-03-12: qty 1

## 2019-03-12 MED ORDER — DICLOFENAC SODIUM 1 % TD GEL
2.0000 g | Freq: Four times a day (QID) | TRANSDERMAL | Status: DC
  Administered 2019-03-12 – 2019-03-16 (×16): 2 g via TOPICAL
  Filled 2019-03-12: qty 100

## 2019-03-12 MED ORDER — METOPROLOL TARTRATE 5 MG/5ML IV SOLN: 5.0000 mg | Freq: Four times a day (QID) | INTRAVENOUS | Status: DC | PRN

## 2019-03-12 MED ORDER — MORPHINE SULFATE (PF) 0.5 MG/ML IJ SOLN: 4.0000 mg | Freq: Once | INTRAMUSCULAR | Status: DC

## 2019-03-12 MED ORDER — FAMOTIDINE IN NACL 20-0.9 MG/50ML-% IV SOLN
20.0000 mg | Freq: Two times a day (BID) | INTRAVENOUS | Status: DC
  Administered 2019-03-12 – 2019-03-13 (×3): 20 mg via INTRAVENOUS
  Filled 2019-03-12 (×3): qty 50

## 2019-03-12 MED ORDER — CHLORHEXIDINE GLUCONATE CLOTH 2 % EX PADS
6.0000 | MEDICATED_PAD | Freq: Every day | CUTANEOUS | Status: DC
  Administered 2019-03-12 – 2019-03-16 (×4): 6 via TOPICAL

## 2019-03-12 MED ORDER — LEVETIRACETAM IN NACL 500 MG/100ML IV SOLN
500.0000 mg | Freq: Two times a day (BID) | INTRAVENOUS | Status: DC
  Administered 2019-03-12 – 2019-03-15 (×7): 500 mg via INTRAVENOUS
  Filled 2019-03-12 (×9): qty 100

## 2019-03-12 MED ORDER — DEXTROSE-NACL 5-0.45 % IV SOLN
INTRAVENOUS | Status: DC
  Administered 2019-03-12 – 2019-03-15 (×5): via INTRAVENOUS

## 2019-03-12 NOTE — H&P (Addendum)
NAME:  Linda Petersen, MRN:  [id number], DOB:  1928/03/01, LOS: 0 ADMISSION DATE:  03/12/2019, CONSULTATION DATE:  03/12/2019 REFERRING MD:  Sherral Hammers, EDP, CHIEF COMPLAINT:  SDH    History of present illness   85 year old female presents to ED with witnessed seizure > tremors to left upper and lower extremity along with head "bobbing", and inability to converse. At baseline patient is wheelchair bound, on Keppra and Eliquis. CT Head with acute on chronic SDH 15 mm right frontoparietal SDH. Given Keppra and Ativan without improvement. Loaded with Fosphenytoin. Given Kcentra 2200. Neurosurgery Consulted recommends no need for surgical intervention at this time, repeat Head CT this AM. Transferred to [facility] for [facility]rology Consult.   Son reports that patient has been declining steadily over the last 2-3 months. Wheelchair/bed bound. Confused and non-verbal at times and then at times will follow commands.   Past Medical History  Seizures, CHF, HTN, HLD, CKD stage 3, Hypothyroidism, Right MCA   Significant Hospital Events   6/28 > Presents to ED 6/29 > Transfer to [facility]:  PCCM  Procedures:  N/A  Significant Diagnostic Tests:  CT Head 6/28 > Mixed density 15 mm thick right frontoparietal subdural hematoma, predominantly low-density suggesting subacute to chronic. No significant mass effect or midline shift CXR 6/28 > Prior median sternotomy and valve replacement. Cardiomegaly. Pacer wires are unchanged. Aortic atherosclerosis. No overt edema, confluent airspace opacities or effusions. No acute bony abnormality  Micro Data:  Blood 6/29 >> U/A 6/29 >>  Antimicrobials:  N/A   Interim history/subjective:    Objective   Blood pressure 126/83, pulse 88, resp. rate (!) 23, height 5\' 2"  (1.575 m), weight 51.3 kg, SpO2 98 %.       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  Plan -Neurology, Neurosurgery Following -Continue Keppra 500 mg BID  -EEG this AM -Repeat Head CT   A.Fib on Eliquis CAD H/O HTN, HLD, CHF  Plan -PRN Metoprolol for HR >120 (Currently rate controlled with HR 88)  -Hold Eliquis in setting of SDH -Hold Lasix, Metoprolol, Pravastatin until Speech Evaluation   Chronic Kidney Disease Stage 3 (Base Crt 1.2-1.5)  Hypokalemia Plan -Trend BMP  -Replace electrolytes as indicated  Concern for Dysphagia  GERD Plan -NPO -Speech Evaluation this AM  -D5 1/2 @ 88ml/hr until PO intake can be achieved  -Continue Pepcid   Hypothyroidism  Plan -Continue Synthroid   Best practice:  Diet: NPO DVT prophylaxis: SCD GI prophylaxis: PPI Glucose control: Trend glucose  Mobility: Bedrest  Code Status: DNR Family Communication: Son updated on plan of care. Confirms DNR status. Patient has been declining steadily over the last 2-3 months. Wheelchair/bed bound. Confused and non-verbal at times and then at times will follow commands >>> Consult Palliative Care   Labs   CBC: Recent Labs  Lab 03/11/19 1755  WBC 4.7  HGB 10.7*  HCT 32.2*  MCV 93.1  PLT 751    Basic Metabolic Panel: Recent Labs  Lab 03/11/19 1755  NA 141  K 3.1*  CL 103  CO2 22  GLUCOSE 129*  BUN 23  CREATININE 1.20*  CALCIUM 8.8*   GFR: Estimated Creatinine Clearance: 25.1 mL/min (A) (by C-G formula based on SCr of 1.2 mg/dL (H)). Recent Labs  Lab 03/11/19 1755   WBC 4.7    Liver Function Tests: Recent Labs  Lab 03/11/19 1755  AST 34  ALT 16  ALKPHOS 76  BILITOT 0.8  PROT 7.6  ALBUMIN 3.9   No results for input(s): LIPASE, AMYLASE in the last 168 hours. No results for input(s): AMMONIA in the last 168 hours.  ABG    Component Value Date/Time   PHART 7.59 (H) 12/08/2017 1624   PCO2ART 22 (L) 12/08/2017 1624   PO2ART 84 12/08/2017 1624   HCO3 21.1 12/08/2017 1624   O2SAT 97.8 12/08/2017 1624     Coagulation Profile: Recent Labs  Lab 03/11/19 2341  INR 1.1    Cardiac Enzymes: No results for input(s): CKTOTAL, CKMB, CKMBINDEX, TROPONINI in the last 168 hours.  HbA1C: No results found for: HGBA1C  CBG: Recent Labs  Lab 03/12/19 0136  GLUCAP 133*    Review of Systems:   Unable to review as patient is confused   Past Medical History  She,  has a past medical history of Anemia, Cancer ([place]), Coronary artery disease, Hypothyroidism, Presence of permanent cardiac pacemaker, Seizures ([place]), and Stroke ([place]).   Surgical History   No past surgical history on file.   Social History   reports that she has never smoked. She has quit using smokeless tobacco. She reports previous alcohol use.   Family History   Her family history is not on file.   Allergies Allergies  Allergen Reactions  . Oxycodone     Unknown reaction.     Home Medications  Prior to Admission medications   Medication Sig Start Date End Date Taking? Authorizing Provider  acetaminophen (TYLENOL) 500 MG tablet Take 500 mg by mouth every 4 (four) hours as needed for mild pain or moderate pain.    [provider]  apixaban (ELIQUIS) 2.5 MG TABS tablet Take 2.5 mg by mouth every 12 (twelve) hours.    [provider]  cefdinir (OMNICEF) 300 MG capsule Take 1 capsule (300 mg total) by mouth 2 (two) times daily. Patient not taking: Reported on 03/11/2019 02/04/19   Versie Starks, PA-C  famotidine (PEPCID) 20 MG tablet Take 20 mg by mouth 2  (two) times daily.    [provider]  ferrous sulfate 325 (65 FE) MG tablet Take 325 mg by mouth every Monday, Wednesday, and Friday.     [provider]  furosemide (LASIX) 40 MG tablet Take 40 mg by mouth daily.    [provider]  levETIRAcetam (KEPPRA) 100 MG/ML solution Take 5 mLs by mouth every 12 (twelve) hours. 12/14/17   [provider]  levothyroxine (SYNTHROID) 88 MCG tablet Take 88 mcg by mouth daily before breakfast.     [provider]  metoprolol succinate (TOPROL-XL) 25 MG 24 hr tablet Take 25 mg by mouth daily.    [provider]  potassium chloride (K-DUR) 10 MEQ tablet Take 1 tablet (10 mEq total) by mouth daily. 02/04/19   Fisher, Linden Dolin, PA-C  pravastatin (PRAVACHOL) 80 MG tablet Take 80 mg by mouth at  bedtime.     [provider]     Critical care time: 42 minutes      Hayden Pedro, AGACNP-BC [facility]  PCCM Pgr: [phone]

## 2019-03-12 NOTE — Progress Notes (Signed)
SLP Cancellation Note  Patient Details Name: Linda Petersen MRN: [id number] DOB: [birth year]   Cancelled treatment:       Reason Eval/Treat Not Completed: Patient at procedure or test/unavailable - getting set up for EEG in room. Will f/u as able.   Linda Petersen 03/12/2019, 9:33 AM  Pollyann Glen, M.A. [place] Acute Environmental education officer [phone] Office [phone]

## 2019-03-12 NOTE — Consult Note (Signed)
Consultation Note Date: 03/12/2019   Patient Name: Linda Petersen  DOB: 1927-10-06  MRN: [id number]  Age / Sex: 85 y.o., female  PCP: [facility] Referring Physician: Collier Bullock, MD  Reason for Consultation: Establishing goals of care  HPI/Patient Profile: 85 y.o. female  with past medical history of CHF, HTN, CKD stage 3, HLD, CAD, h/o R MCA stroke with residual left sided weakness, h/o seizures admitted on 03/12/2019 with concern for seizure with left-sided shaking and forced right gaze and found to have right frontal subdural hematoma. Conservative measures to control seizures in place. No plans for surgical intervention. No plans to restart anticoagulants. Overall prognosis poor d/t declining functional status even prior to seizures. Family denies any falls or possible head trauma.   Clinical Assessment and Goals of Care: I met today at Linda Petersen' bedside along with Dr. Tamala Julian and with two of Linda Petersen' Petersen (6 living children of 10 total). Dr. Tamala Julian had discussion with family explaining her overall poor prognosis but that she is currently stable. Son, Linda Petersen, states that he is HCPOA and makes decisions for his mother and other brother verifies that decisions are up to Linda Petersen.   I spoke further with Linda Petersen after Dr. Tamala Julian gave his update and Linda Petersen is concerned about being able to care for his mother given her decline. We discussed potential scenarios of what could happen over the next few days. Although she is stable she remains at risk for clots, strokes, and I am concerned with her ability to improve mental state to be able to swallow and then to be able to maintain adequate nutrition and hydration. Linda Petersen understands. He confirms that his mother desires DNR and would not want invasive or aggressive interventions such as surgery. He also shares that she would NOT want a feeding tube or artificial  feeding. We did discuss the possibility of transition to hospice but overall prognosis yet to be determined. Sounds like they would not be able to meet care needs with hospice at home ([facility] sometimes likes to provide their own end of life care for their patients) and would need to qualify with prognosis of < 2 weeks to go to hospice facility. I will follow for progress and recommendations given outcomes. Will continue to update and support family.   Primary Decision Maker HCPOA son Linda Petersen     SUMMARY OF RECOMMENDATIONS   - Continue current treatments - No escalation of to invasive measures (NO artificial feeding/feeding tube) - Consideration of hospice care and disposition options TBD and discussed based on progress  Code Status/Advance Care Planning:  DNR   Symptom Management:   Per PCCM. Linda Petersen agrees with medications for pain and comfort as needed.   Chronic right knee arthritic pain: Began voltaren gel as Linda Petersen says this helps at home.   Palliative Prophylaxis:   Aspiration, Delirium Protocol, Frequent Pain Assessment, Oral Care and Turn Reposition  Additional Recommendations (Limitations, Scope, Preferences):  No Artificial Feeding and No Surgical Procedures  Psycho-social/Spiritual:   Desire for further  Chaplaincy support:yes  Additional Recommendations: Caregiving  Support/Resources, Education on Hospice and Grief/Bereavement Support  Prognosis:   TBD over the next 24-48 hours. Eligible for hospice at home < 6 months but may be much less depending on outcomes.   Discharge Planning: To Be Determined      Primary Diagnoses: Present on Admission: **None**   I have reviewed the medical record, interviewed the patient and family, and examined the patient. The following aspects are pertinent.  Past Medical History:  Diagnosis Date  . Anemia   . Cancer [facility])    Colon  . Coronary artery disease   . Hypothyroidism   . Presence of permanent cardiac pacemaker   .  Seizures ([place])   . Stroke [facility])    left side weakness   Social History   Socioeconomic History  . Marital status: Widowed    Spouse name: Not on file  . Number of children: Not on file  . Years of education: Not on file  . Highest education level: Not on file  Occupational History  . Not on file  Social Needs  . Financial resource strain: Not on file  . Food insecurity    Worry: Not on file    Inability: Not on file  . Transportation needs    Medical: Not on file    Non-medical: Not on file  Tobacco Use  . Smoking status: Never Smoker  . Smokeless tobacco: Former Network engineer and Sexual Activity  . Alcohol use: Not Currently  . Drug use: Not on file  . Sexual activity: Not on file  Lifestyle  . Physical activity    Days per week: Not on file    Minutes per session: Not on file  . Stress: Not on file  Relationships  . Social Herbalist on phone: Not on file    Gets together: Not on file    Attends religious service: Not on file    Active member of club or organization: Not on file    Attends meetings of clubs or organizations: Not on file    Relationship status: Not on file  Other Topics Concern  . Not on file  Social History Narrative  . Not on file   No family history on file. Scheduled Meds: . Chlorhexidine Gluconate Cloth  6 each Topical Q0600  . levothyroxine  44 mcg Intravenous Daily   Continuous Infusions: . dextrose 5 % and 0.45% NaCl 75 mL/hr at 03/12/19 0800  . famotidine (PEPCID) IV 20 mg (03/12/19 1003)  . levETIRAcetam     PRN Meds:.metoprolol tartrate Allergies  Allergen Reactions  . Oxycodone     Unknown reaction.   Review of Systems  Unable to perform ROS: Acuity of condition    Physical Exam Vitals signs and nursing note reviewed.  Constitutional:      General: She is sleeping. She is not in acute distress.    Appearance: She is ill-appearing.  Cardiovascular:     Rate and Rhythm: Normal rate.  Pulmonary:      Effort: Pulmonary effort is normal. No tachypnea, accessory muscle usage or respiratory distress.  Abdominal:     Palpations: Abdomen is soft.  Neurological:     Comments: Sleeping comfortably. I did not attempt to arouse. Baseline confusion and limited verbalization since previous stroke.      Vital Signs: BP 128/83   Pulse 83   Temp 97.6 F (36.4 C) (Oral)   Resp 16   Ht 5'  2" (1.575 m)   Wt 51.3 kg   SpO2 100%   BMI 20.69 kg/m  Pain Scale: PAINAD   Pain Score: 1    SpO2: SpO2: 100 % O2 Device:SpO2: 100 % O2 Flow Rate: .   IO: Intake/output summary:   Intake/Output Summary (Last 24 hours) at 03/12/2019 1020 Last data filed at 03/12/2019 0800 Gross per 24 hour  Intake 350.69 ml  Output -  Net 350.69 ml    LBM: Last BM Date: (PTA) Baseline Weight: Weight: 51.3 kg Most recent weight: Weight: 51.3 kg     Palliative Assessment/Data:     Time In: 1000 Time Out: 1100 Time Total: 60 min Greater than 50%  of this time was spent counseling and coordinating care related to the above assessment and plan.  Signed by: Vinie Sill, NP Palliative Medicine Team Pager # [phone] (M-F 8a-5p) Team Phone # [phone] (Nights/Weekends)

## 2019-03-12 NOTE — Consult Note (Addendum)
Neurology Consultation  Reason for Consult: Seizure, status epilepticus Referring Physician: Dr. Duffy Bruce at [facility] emergency room [facility]  CC: Seizure activity  History is obtained from: Chart review, son-Greg Ducey at [phone]  HPI: Linda Petersen is a 85 y.o. female who has a past medical history of prior stroke with residual left-sided weakness, chronic atrial fibrillation on Eliquis, pacemaker, coronary artery disease, cancer of the colon, anemia, significant cognitive deficits to the point requiring 24/7 care at home, modified Rankin 4-5, history of seizures on Keppra, was brought into [facility] for concern for seizure on the evening of 03/11/2019.  She had some head bobbing along with some rhythmic movement of the left upper and lower extremity which was new for the patient.  It was described that she had some changes in her breathing pattern as well as episode of unresponsiveness with her eyes rolling back. She was evaluated by telemedicine neurology at [facility], found to have forced right gaze with not following commands, lip twitching and episodic left sided seizure-like activity.  Loaded with Keppra and transferred to [facility] for concern for ongoing partial status epilepticus due to left-sided shaking that continued even after giving some Ativan. The case was discussed with me prior to transfer over the phone.  I recommended a load of fosphenytoin. Presumably the Keppra and fosphenytoin along with a benzodiazepine helped stop the seizures. Noncontrast head CT done at [facility] was concerning for a subacute to chronic subdural hematoma.  Given the patient is on Eliquis, neurosurgery was consulted and recommended reversing the Eliquis, which was done with 4 factor Halma.  I spoke with the son over the phone.  He describes her mentation and cognition becoming worse over the past 2 to 3 months, especially after her  seizure in March after which she showed some recovery but has been deteriorating.  She has been receiving care through [facility] but because of the COVID-19 epidemic, that has not been consistent over the past 3 months.  He says that at baseline, she at times would follow commands and other times would not follow commands at all.  She is barely verbal.  She requires assistance with all ADLs including feeding bathing grooming etc.  Care everywhere notes from [place] from March 2020 reviewed.  She was admitted with concern for seizure in the presence of some metabolic derangements as well as UTI at that time.  She had clinical seizures but the continuous EEG showed only slowing and infrequent spikes-no evidence of nonconvulsive status epilepticus at that time.  Last known well time-3 PM on 03/11/2019 tPA given- no - SDH and seizure.  Decision made by telemedicine neurology. Premorbid modified Rankin scale-4-5  ROS:  Unable to obtain due to altered mental status.   Past Medical History:  Diagnosis Date  . Anemia   . Cancer [facility])    Colon  . Coronary artery disease   . Hypothyroidism   . Presence of permanent cardiac pacemaker   . Seizures ([place])   . Stroke [facility])    left side weakness  No family history on file.-Patient unable to provide family history   Social History:   reports that she has never smoked. She has quit using smokeless tobacco. She reports previous alcohol use. No history on file for drug.   Medications  Current Facility-Administered Medications:  .  famotidine (PEPCID) IVPB 20 mg premix, 20 mg, Intravenous, Q12H, Eubanks, Katalina M, NP .  levETIRAcetam (KEPPRA) IVPB 500 mg/100  mL premix, 500 mg, Intravenous, Q12H, Eubanks, Danie Chandler, NP   Exam: Current vital signs: There were no vitals taken for this visit. Vital signs in last 24 hours: Temp:  [98.1 F (36.7 C)-98.4 F (36.9 C)] 98.4 F (36.9 C) (06/29 0029) Pulse Rate:  [72-86] 85 (06/29 0029) Resp:  [16-25]  22 (06/29 0029) BP: (112-145)/(81-89) 145/85 (06/29 0029) SpO2:  [99 %] 99 % (06/29 0029) Weight:  [49.9 kg] 49.9 kg (06/28 1750) General: Cachectic appearing woman in no acute distress HEENT: Cephalic atraumatic with dry face membrane, edentulous CVS: Regular rate rhythm Respiratory: Chest clear Extremities: Warm well perfused with no edema Neurological exam Mental status: Patient is awake, tracks the examiner but is nonverbal. She does not follow any commands. Cranial nerves: Pupils equal round reactive light, question right gaze preference but no forced gaze deviation at this time.  Is able to reach out with her right hand to gesture for help.  Blinks to threat inconsistently from both sides.  Left facial weakness noted on exam. Motor exam: Moving right side of the body 5/5.  Is flaccid on the left upper and lower extremity. Sensory exam: Decreased sensation to noxious immolation on the left. Does not perform finger-nose-finger or heel-knee-shin testing.  Labs I have reviewed labs in epic and the results pertinent to this consultation are:  CBC    Component Value Date/Time   WBC 4.7 03/11/2019 1755   RBC 3.46 (L) 03/11/2019 1755   HGB 10.7 (L) 03/11/2019 1755   HCT 32.2 (L) 03/11/2019 1755   PLT 170 03/11/2019 1755   MCV 93.1 03/11/2019 1755   MCH 30.9 03/11/2019 1755   MCHC 33.2 03/11/2019 1755   RDW 15.2 03/11/2019 1755   LYMPHSABS 1.2 02/04/2019 1533   MONOABS 0.6 02/04/2019 1533   EOSABS 0.1 02/04/2019 1533   BASOSABS 0.1 02/04/2019 1533    CMP     Component Value Date/Time   NA 141 03/11/2019 1755   K 3.1 (L) 03/11/2019 1755   CL 103 03/11/2019 1755   CO2 22 03/11/2019 1755   GLUCOSE 129 (H) 03/11/2019 1755   BUN 23 03/11/2019 1755   CREATININE 1.20 (H) 03/11/2019 1755   CALCIUM 8.8 (L) 03/11/2019 1755   PROT 7.6 03/11/2019 1755   ALBUMIN 3.9 03/11/2019 1755   AST 34 03/11/2019 1755   ALT 16 03/11/2019 1755   ALKPHOS 76 03/11/2019 1755   BILITOT 0.8  03/11/2019 1755   GFRNONAA 40 (L) 03/11/2019 1755   GFRAA 46 (L) 03/11/2019 1755   Imaging I have reviewed the images obtained:  CT-scan of the brain-mixed density 67mm thick right frontoparietal subdural hematoma, predominantly low density suggesting acute on subacute to chronic. Evidence of old RMCA stroke  Assessment: 85 year old woman with past medical history of right MCA stroke with residual left sided weakness, chronic A. fib on Eliquis, pacemaker, coronary artery disease, colon cancer, anemia, significantly progressive cognitive deficits more so over the past 3 to 4 months requiring 24/7 home care with a modified Rankin score of 4-5, history of seizures from March 2020-on Keppra, presented to [facility] for concern for seizures due to left-sided shaking and forced right gaze. Continue to have some seizure activity even after giving benzodiazepines requiring a telemedicine neurology consult was recommended that patient be loaded with Keppra.  Some decrease in seizure-like activity but eventually seizures broke with addition of fosphenytoin load. Since then, no seizure activity noted.  [facility]. On my examination,  she does have some left-sided weakness which appears to be worse than that documented in the notes from [place] in care everywhere from March but the son says that she has been deteriorating over the past few months. I presume that her worsening left-sided weakness is probably postictal. She does not look to be having an active seizure at this time and I suspect that her current presentation is likely related to breakthrough seizure in the setting of the subacute subdural hematoma as well as toxic metabolic derangements.  Impression: Breakthrough seizure-possibly status epilepticus now resolved. Subacute to chronic subdural hematoma Evaluate for toxic metabolic derangement/toxic metabolic encephalopathy. History of right MCA stroke with  residual left-sided weakness   Recommendations: I would only continue Keppra 500 twice daily for now. She had more drowsiness based on the care everywhere notes to higher doses of Keppra. I would hold off on continuing Dilantin for now given her frailty, no more seizure activity as well as possible sedating side effects of the medication. I would do frequent neurochecks in the ICU. I will get a routine EEG in the morning. Neurosurgery has been consulted for the subdural hematoma.  No intervention planned.  Repeat CT head in the morning. No antiplatelets or anticoagulants Management of toxic metabolic derangements per primary team as you are. Check urinalysis, chest x-ray to rule out any pneumonia or UTI. Neurology will continue to follow with you.  Discussed my plan in detail with the primary team attending Dr. Patsey Berthold and Hayden Pedro, NP in the intensive care unit.   -- Amie Portland, MD [facility] Pager: [phone] If 7pm to 7am, please call on call as listed on AMION.  CRITICAL CARE ATTESTATION Performed by: Amie Portland, MD Total critical care time: 40 minutes Critical care time was exclusive of separately billable procedures and treating other patients and/or supervising APPs/Residents/Students Critical care was necessary to treat or prevent imminent or life-threatening deterioration due to seizures, subdural hematoma, toxic metabolic encephalopathy. This patient is critically ill and at significant risk for neurological worsening and/or death and care requires constant monitoring. Critical care was time spent personally by me on the following activities: development of treatment plan with patient and/or surrogate as well as nursing, discussions with consultants, evaluation of patient's response to treatment, examination of patient, obtaining history from patient or surrogate, ordering and performing treatments and interventions, ordering and review of laboratory  studies, ordering and review of radiographic studies, pulse oximetry, re-evaluation of patient's condition, participation in multidisciplinary rounds and medical decision making of high complexity in the care of this patient.

## 2019-03-12 NOTE — Progress Notes (Signed)
Interim Note  Patient moaning in bed all night.  I think her leg hurts but it is tough to get history from her.  Can get her to move R but not L.  She will say her name.  No further seizure activity.    I am having son come in to discuss goals of care but my preliminary conversation with him he does not seem to understand her limited lifespan.  Palliative medicine may be able to help reinforce, I have reached out to their answering service.  Erskine Emery MD

## 2019-03-12 NOTE — Progress Notes (Signed)
Subjective: No seizure activity overnight. Moving left side less than right per chart. CCM is consulting palliative care.    Objective: Current vital signs: BP 140/83   Pulse 75   Temp 97.6 F (36.4 C) (Oral)   Resp (!) 24   Ht 5\' 2"  (1.575 m)   Wt 51.3 kg   SpO2 100%   BMI 20.69 kg/m  Vital signs in last 24 hours: Temp:  [97.5 F (36.4 C)-98.4 F (36.9 C)] 97.6 F (36.4 C) (06/29 0733) Pulse Rate:  [72-88] 75 (06/29 0200) Resp:  [15-25] 24 (06/29 0608) BP: (112-145)/(57-89) 140/83 (06/29 0608) SpO2:  [98 %-100 %] 100 % (06/29 0200) Weight:  [49.9 kg-51.3 kg] 51.3 kg (06/29 0135)  Intake/Output from previous day: 06/28 0701 - 06/29 0700 In: 208.6 [I.V.:208.6] Out: -  Intake/Output this shift: No intake/output data recorded. Nutritional status:  Diet Order            Diet NPO time specified  Diet effective now             HEENT: Normocephalic Lungs: Respirations unlabored Ext: No edema  Neurologic Exam: Ment: Opens eyes to voice. Able to count 5 fingers and say her name. Follows some simple commands. Increased latency of responses.   CN: Will fixate on examiner's face and other visual stimuli. Eyes are conjugate without forced gaze deviation. No facial droop.  Motor/Sensory: Moves RUE and RLE more briskly than left to command and light noxious stimuli. Will lift RUE antigravity and resist with flexion at elbow and with grip. Does not lift LUE antigravity. Withdraws BLE weakly to plantar stimulation with 2/5 strength.   Lab Results: Results for orders placed or performed during the hospital encounter of 03/12/19 (from the past 48 hour(s))  Glucose, capillary     Status: Abnormal   Collection Time: 03/12/19  1:36 AM  Result Value Ref Range   Glucose-Capillary 133 (H) 70 - 99 mg/dL  MRSA PCR Screening     Status: None   Collection Time: 03/12/19  1:43 AM   Specimen: Nasal Mucosa; Nasopharyngeal  Result Value Ref Range   MRSA by PCR NEGATIVE NEGATIVE     Comment:        The GeneXpert MRSA Assay (FDA approved for NASAL specimens only), is one component of a comprehensive MRSA colonization surveillance program. It is not intended to diagnose MRSA infection nor to guide or monitor treatment for MRSA infections. Performed at [facility], [place] [address] [place], [place] [address]   Urinalysis, Routine w reflex microscopic     Status: Abnormal   Collection Time: 03/12/19  2:12 AM  Result Value Ref Range   Color, Urine STRAW (A) YELLOW   APPearance CLEAR CLEAR   Specific Gravity, Urine 1.009 1.005 - 1.030   pH 7.0 5.0 - 8.0   Glucose, UA NEGATIVE NEGATIVE mg/dL   Hgb urine dipstick NEGATIVE NEGATIVE   Bilirubin Urine NEGATIVE NEGATIVE   Ketones, ur NEGATIVE NEGATIVE mg/dL   Protein, ur 30 (A) NEGATIVE mg/dL   Nitrite NEGATIVE NEGATIVE   Leukocytes,Ua NEGATIVE NEGATIVE   RBC / HPF 0-5 0 - 5 RBC/hpf   WBC, UA 0-5 0 - 5 WBC/hpf   Bacteria, UA NONE SEEN NONE SEEN   Squamous Epithelial / LPF 0-5 0 - 5    Comment: Performed at [facility], [place] [address], [place],  [address]  Basic metabolic panel     Status: Abnormal   Collection Time: 03/12/19  2:14  AM  Result Value Ref Range   Sodium 138 135 - 145 mmol/L   Potassium 5.4 (H) 3.5 - 5.1 mmol/L   Chloride 103 98 - 111 mmol/L   CO2 25 22 - 32 mmol/L   Glucose, Bld 130 (H) 70 - 99 mg/dL   BUN 16 8 - 23 mg/dL   Creatinine, Ser 1.06 (H) 0.44 - 1.00 mg/dL   Calcium 8.4 (L) 8.9 - 10.3 mg/dL   GFR calc non Af Amer 47 (L) >60 mL/min   GFR calc Af Amer 54 (L) >60 mL/min   Anion gap 10 5 - 15    Comment: Performed at [facility] [address] [place], [place] [address]  CBC     Status: Abnormal   Collection Time: 03/12/19  2:14 AM  Result Value Ref Range   WBC 4.4 4.0 - 10.5 K/uL   RBC 3.47 (L) 3.87 - 5.11 MIL/uL   Hemoglobin 10.8 (L) 12.0 - 15.0 g/dL   HCT 31.8 (L) 36.0 - 46.0 %   MCV 91.6 80.0 - 100.0 fL   MCH 31.1 26.0 - 34.0 pg   MCHC 34.0 30.0 -  36.0 g/dL   RDW 15.1 11.5 - 15.5 %   Platelets 134 (L) 150 - 400 K/uL   nRBC 0.0 0.0 - 0.2 %    Comment: Performed at [facility], [place] [address] [place], [place] [address]  Magnesium     Status: None   Collection Time: 03/12/19  2:14 AM  Result Value Ref Range   Magnesium 2.1 1.7 - 2.4 mg/dL    Comment: Performed at [facility] [address] [place], [place] [address]  Phosphorus     Status: None   Collection Time: 03/12/19  2:14 AM  Result Value Ref Range   Phosphorus 2.5 2.5 - 4.6 mg/dL    Comment: Performed at [facility] [address] [place], [place] [address]  Procalcitonin     Status: None   Collection Time: 03/12/19  3:43 AM  Result Value Ref Range   Procalcitonin <0.10 ng/mL    Comment:        Interpretation: PCT (Procalcitonin) <= 0.5 ng/mL: Systemic infection (sepsis) is not likely. Local bacterial infection is possible. REPEATED TO VERIFY (NOTE)       Sepsis PCT Algorithm           Lower Respiratory Tract                                      Infection PCT Algorithm    ----------------------------     ----------------------------         PCT < 0.25 ng/mL                PCT < 0.10 ng/mL         Strongly encourage             Strongly discourage   discontinuation of antibiotics    initiation of antibiotics    ----------------------------     -----------------------------       PCT 0.25 - 0.50 ng/mL            PCT 0.10 - 0.25 ng/mL               OR       >80% decrease in PCT  Discourage initiation of                                            antibiotics      Encourage discontinuation           of antibiotics    ----------------------------     -----------------------------         PCT >= 0.50 ng/mL              PCT 0.26 - 0.50 ng/mL                AND       <80% decrease in PCT             Encourage initiation of                                             antibiotics       Encourage continuation           of antibiotics     ----------------------------     -----------------------------        PCT >= 0.50 ng/mL                  PCT > 0.50 ng/mL               AND         increase in PCT                  Strongly encourage                                      initiation of antibiotics    Strongly encourage escalation           of antibiotics                                     -----------------------------                                           PCT <= 0.25 ng/mL                                                 OR                                        > 80% decrease in PCT                                     Discontinue / Do not initiate  antibiotics Performed at [facility], [place] [address], [place], [place] [address]     Recent Results (from the past 240 hour(s))  SARS Coronavirus 2 (CEPHEID - Performed in [facility] hospital lab), Hosp Order     Status: None   Collection Time: 03/11/19  9:37 PM   Specimen: Nasopharyngeal Swab  Result Value Ref Range Status   SARS Coronavirus 2 NEGATIVE NEGATIVE Final    Comment: (NOTE) If result is NEGATIVE SARS-CoV-2 target nucleic acids are NOT DETECTED. The SARS-CoV-2 RNA is generally detectable in upper and lower  respiratory specimens during the acute phase of infection. The lowest  concentration of SARS-CoV-2 viral copies this assay can detect is 250  copies / mL. A negative result does not preclude SARS-CoV-2 infection  and should not be used as the sole basis for treatment or other  patient management decisions.  A negative result may occur with  improper specimen collection / handling, submission of specimen other  than nasopharyngeal swab, presence of viral mutation(s) within the  areas targeted by this assay, and inadequate number of viral copies  (<250 copies / mL). A negative result must be combined with clinical  observations, patient history, and epidemiological information. If result is  POSITIVE SARS-CoV-2 target nucleic acids are DETECTED. The SARS-CoV-2 RNA is generally detectable in upper and lower  respiratory specimens dur ing the acute phase of infection.  Positive  results are indicative of active infection with SARS-CoV-2.  Clinical  correlation with patient history and other diagnostic information is  necessary to determine patient infection status.  Positive results do  not rule out bacterial infection or co-infection with other viruses. If result is PRESUMPTIVE POSTIVE SARS-CoV-2 nucleic acids MAY BE PRESENT.   A presumptive positive result was obtained on the submitted specimen  and confirmed on repeat testing.  While 2019 novel coronavirus  (SARS-CoV-2) nucleic acids may be present in the submitted sample  additional confirmatory testing may be necessary for epidemiological  and / or clinical management purposes  to differentiate between  SARS-CoV-2 and other Sarbecovirus currently known to infect humans.  If clinically indicated additional testing with an alternate test  methodology [phone]) is advised. The SARS-CoV-2 RNA is generally  detectable in upper and lower respiratory sp ecimens during the acute  phase of infection. The expected result is Negative. Fact Sheet for Patients:  StrictlyIdeas.no Fact Sheet for Healthcare Providers: BankingDealers.co.za This test is not yet approved or cleared by the [place] FDA and has been authorized for detection and/or diagnosis of SARS-CoV-2 by FDA under an Emergency Use Authorization (EUA).  This EUA will remain in effect (meaning this test can be used) for the duration of the COVID-19 declaration under Section 564(b)(1) of the Act, 21 U.S.C. section 360bbb-3(b)(1), unless the authorization is terminated or revoked sooner. Performed at [facility], [address] [place], [place] [address]   MRSA PCR Screening     Status: None   Collection  Time: 03/12/19  1:43 AM   Specimen: Nasal Mucosa; Nasopharyngeal  Result Value Ref Range Status   MRSA by PCR NEGATIVE NEGATIVE Final    Comment:        The GeneXpert MRSA Assay (FDA approved for NASAL specimens only), is one component of a comprehensive MRSA colonization surveillance program. It is not intended to diagnose MRSA infection nor to guide or monitor treatment for MRSA infections. Performed at [facility], [place] [address], [place], [place] [address]  Lipid Panel No results for input(s): CHOL, TRIG, HDL, CHOLHDL, VLDL, LDLCALC in the last 72 hours.  Studies/Results: Ct Head Wo Contrast  Result Date: 03/12/2019 CLINICAL DATA:  Subdural hematoma. EXAM: CT HEAD WITHOUT CONTRAST TECHNIQUE: Contiguous axial images were obtained from the base of the skull through the vertex without intravenous contrast. COMPARISON:  CT scan of March 11, 2019. FINDINGS: Brain: Stable right frontal subdural hematoma is noted of mixed density. Ventricular size is within normal limits. No significant midline shift is noted. Mild chronic ischemic white matter disease is noted. No acute infarction or mass lesion is noted. Vascular: No hyperdense vessel or unexpected calcification. Skull: Normal. Negative for fracture or focal lesion. Sinuses/Orbits: No acute finding. Other: None. IMPRESSION: Stable right frontal subdural hematoma is noted. Electronically Signed   By: Marijo Conception M.D.   On: 03/12/2019 07:18   Ct Head Wo Contrast  Result Date: 03/11/2019 CLINICAL DATA:  Seizure EXAM: CT HEAD WITHOUT CONTRAST TECHNIQUE: Contiguous axial images were obtained from the base of the skull through the vertex without intravenous contrast. COMPARISON:  02/04/2019 FINDINGS: Brain: Old right MCA infarct. There Is extra-axial fluid collection of variable density overlying the right frontal and parietal lobes measuring 15 mm in thickness compatible with subdural hematoma. This is predominantly low-density,  likely chronic but new since prior study. No hydrocephalus, mass effect or midline shift. Vascular: No hyperdense vessel or unexpected calcification. Skull: No acute calvarial abnormality. Sinuses/Orbits: Visualized paranasal sinuses and mastoids clear. Orbital soft tissues unremarkable. Other: None IMPRESSION: Mixed density 15 mm thick right frontoparietal subdural hematoma, predominantly low-density suggesting subacute to chronic. No significant mass effect or midline shift. These results were called by telephone at the time of interpretation on 03/11/2019 at 7:27 pm to Dr. Duffy Bruce , who verbally acknowledged these results. Electronically Signed   By: Rolm Baptise M.D.   On: 03/11/2019 19:30   Dg Chest Port 1 View  Result Date: 03/12/2019 CLINICAL DATA:  Acute respiratory failure. EXAM: PORTABLE CHEST 1 VIEW COMPARISON:  03/11/2019. FINDINGS: Patient is rotated. AICD in stable position. Prior median sternotomy and cardiac valve replacement. Stable cardiomegaly. No pulmonary venous congestion. Low lung volumes with bibasilar atelectasis. No pleural effusion or pneumothorax. IMPRESSION: 1. AICD in stable position. Prior median sternotomy and cardiac valve replacement. Stable cardiomegaly. 2. Low lung volumes with bibasilar atelectasis again noted. No significant change. Electronically Signed   By: Marcello Moores  Register   On: 03/12/2019 06:52   Dg Chest Portable 1 View  Result Date: 03/11/2019 CLINICAL DATA:  Seizure-like activity.  Evaluate for aspiration EXAM: PORTABLE CHEST 1 VIEW COMPARISON:  02/04/2019 FINDINGS: Prior median sternotomy and valve replacement. Cardiomegaly. Pacer wires are unchanged. Aortic atherosclerosis. No overt edema, confluent airspace opacities or effusions. No acute bony abnormality. IMPRESSION: Cardiomegaly.  No active disease. Electronically Signed   By: Rolm Baptise M.D.   On: 03/11/2019 19:49    Medications:  Scheduled: . levothyroxine  44 mcg Intravenous Daily    Continuous: . dextrose 5 % and 0.45% NaCl Stopped (03/12/19 0535)  . famotidine (PEPCID) IV    . levETIRAcetam      CT head: 45mm thick right frontoparietal subdural hematoma, predominantly low density suggesting acute on subacute to chronic. 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 fib on Eliquis, pacemaker, coronary artery disease, colon cancer, anemia, significantly progressive cognitive deficits more so over the past 3 to 4  months requiring 24/7 home care with a modified Rankin score of 4-5, history of seizures from March 2020. On Keppra, presented to [facility] for concern for seizures due to left-sided shaking and forced right gaze. 1. Continued to have some seizure activity even after giving benzodiazepines requiring a telemedicine Neurology consult was recommended that patient be loaded with Keppra. Some decrease in seizure-like activity but eventually seizures broke with addition of fosphenytoin load. 2. Since then, no seizure activity noted.  [facility]. On my examination, she does have some left-sided weakness which appears to be worse than that documented in the notes from [facility] in care everywhere from March but the son says that she has been deteriorating over the past few months. 3. Worsening left-sided weakness over her baseline secondary to old right MCA stroke may be postictal, or due to her right subdural hematoma 4. No seizure activity overnight or on repeat exam this morning.  5. Suspect that her current presentation is likely related to breakthrough seizure in the setting of the subacute subdural hematoma as well as toxic metabolic derangements. 6. History of right MCA stroke with residual left-sided weakness. 7. Continue to hold off on additional Dilantin given her frailty, no more seizure activity as well  as possible sedating side effects of the medication. 8. Routine EEG in the morning. 9. Neurosurgery has been consulted for the subdural hematoma.  No intervention planned.  Repeat CT head in the morning. No antiplatelets or anticoagulants. Has had anticoagulant reversed.  10. Management of toxic metabolic derangements per primary team as you are. 11. If EEG is negative for seizure activity, continue Keppra at current dose of 500 mg BID.   25 minutes of critical care time.    LOS: 0 days   @Electronically  signed: Dr. Kerney Elbe 03/12/2019  8:12 AM

## 2019-03-12 NOTE — Progress Notes (Signed)
eLink Physician-Brief Progress Note Patient Name: Linda Petersen DOB: 06-Apr-1928 MRN: [id number]   Date of Service  03/12/2019  HPI/Events of Note  85 yr old female with hx of Seizure disorder on keppra, old rt cva with left weakness admitted to ICU for 1) Partial complex seizures. CTH showing acute on chronic SDH. Neuro surgery. neurology adviced eliquis reversal, neuro check in ICU. No hx of fall. 2) PM status. 3) hypothyroidism. 40 Covid neg.  4) chronic anemia. Hx of colon cancer .  Camera assesement done: Hemodynamically stable. protecting airways.     eICU Interventions  - continue care - seizure and asp precautions. - discussed earlier with ED doc - Neurology notes seen. - CTH and EEG in am - neuro checks - SCd as VTE ordered. - CBG goal < 180 - SBP < 140.       Intervention Category Major Interventions: Seizures - evaluation and management;Hemorrhage - evaluation and management Evaluation Type: New Patient Evaluation  Elmer Sow 03/12/2019, 2:35 AM

## 2019-03-12 NOTE — Progress Notes (Signed)
EEG Complete  Results Pending 

## 2019-03-12 NOTE — Consult Note (Addendum)
Chief Complaint   No chief complaint on file.   HPI   Consult requested by: Dr Ellender Hose Reason for consult: SDH  HPI: Linda Petersen is a 85 y.o. female with history of CHF, HTN, hyperlipidemia, CKD stage III, hypothyroidism, afib on eliquis, CVA with resultant left sided weakness, prior seizures (on Keppra) who was brought to ED due to witnessed seizure. By report had head bobbing with rhythmic movement of LUE and LLE. Also noted to have episode of eyes rolling back. She was evaluated by tele-neurology due to persistent episodic left sided twitching. There was concern of status epilepticus. CT head ordered and revealed right SDH. Patient transferred to [facility] for [place] and Neuro consultation. Eliquis was reversed at [facility].  Neurology talked to son over the phone. Reportedly patient has had worsening mentation/cognition over the past 2-3 months. At baseline she intermittently follows commands, is barely verbal and requires assistance for all ADLs. She is wheelchair bound with left sided flaccidity.   Patient Active Problem List   Diagnosis Date Noted  . Seizure ([place]) 03/12/2019    PMH: Past Medical History:  Diagnosis Date  . Anemia   . Cancer [facility])    Colon  . Coronary artery disease   . Hypothyroidism   . Presence of permanent cardiac pacemaker   . Seizures ([place])   . Stroke [facility])    left side weakness    PSH: No past surgical history on file.  Medications Prior to Admission  Medication Sig Dispense Refill Last Dose  . acetaminophen (TYLENOL) 500 MG tablet Take 500 mg by mouth every 4 (four) hours as needed for mild pain or moderate pain.     Marland Kitchen apixaban (ELIQUIS) 2.5 MG TABS tablet Take 2.5 mg by mouth every 12 (twelve) hours.     . cefdinir (OMNICEF) 300 MG capsule Take 1 capsule (300 mg total) by mouth 2 (two) times daily. (Patient not taking: Reported on 03/11/2019) 20 capsule 0   . famotidine (PEPCID) 20 MG tablet Take 20 mg by mouth 2 (two) times daily.     . ferrous sulfate  325 (65 FE) MG tablet Take 325 mg by mouth every Monday, Wednesday, and Friday.      . furosemide (LASIX) 40 MG tablet Take 40 mg by mouth daily.     Marland Kitchen levETIRAcetam (KEPPRA) 100 MG/ML solution Take 5 mLs by mouth every 12 (twelve) hours.     Marland Kitchen levothyroxine (SYNTHROID) 88 MCG tablet Take 88 mcg by mouth daily before breakfast.      . metoprolol succinate (TOPROL-XL) 25 MG 24 hr tablet Take 25 mg by mouth daily.     . potassium chloride (K-DUR) 10 MEQ tablet Take 1 tablet (10 mEq total) by mouth daily. 30 tablet 0   . pravastatin (PRAVACHOL) 80 MG tablet Take 80 mg by mouth at bedtime.        SH: Social History   Tobacco Use  . Smoking status: Never Smoker  . Smokeless tobacco: Former Network engineer Use Topics  . Alcohol use: Not Currently  . Drug use: Not on file    MEDS: Prior to Admission medications   Medication Sig Start Date End Date Taking? Authorizing Provider  acetaminophen (TYLENOL) 500 MG tablet Take 500 mg by mouth every 4 (four) hours as needed for mild pain or moderate pain.    [provider]  apixaban (ELIQUIS) 2.5 MG TABS tablet Take 2.5 mg by mouth every 12 (twelve) hours.    [provider]  cefdinir (OMNICEF) 300 MG capsule Take 1 capsule (300 mg total) by mouth 2 (two) times daily. Patient not taking: Reported on 03/11/2019 02/04/19   Versie Starks, PA-C  famotidine (PEPCID) 20 MG tablet Take 20 mg by mouth 2 (two) times daily.    [provider]  ferrous sulfate 325 (65 FE) MG tablet Take 325 mg by mouth every Monday, Wednesday, and Friday.     [provider]  furosemide (LASIX) 40 MG tablet Take 40 mg by mouth daily.    [provider]  levETIRAcetam (KEPPRA) 100 MG/ML solution Take 5 mLs by mouth every 12 (twelve) hours. 12/14/17   [provider]  levothyroxine (SYNTHROID) 88 MCG tablet Take 88 mcg by mouth daily before breakfast.     [provider]  metoprolol succinate (TOPROL-XL) 25 MG 24 hr  tablet Take 25 mg by mouth daily.    [provider]  potassium chloride (K-DUR) 10 MEQ tablet Take 1 tablet (10 mEq total) by mouth daily. 02/04/19   Fisher, Linden Dolin, PA-C  pravastatin (PRAVACHOL) 80 MG tablet Take 80 mg by mouth at bedtime.     [provider]    ALLERGY: Allergies  Allergen Reactions  . Oxycodone     Unknown reaction.    Social History   Tobacco Use  . Smoking status: Never Smoker  . Smokeless tobacco: Former Network engineer Use Topics  . Alcohol use: Not Currently     No family history on file.   ROS   ROS  Exam   Vitals:   03/12/19 0135  BP: 126/83  Pulse: 88  Resp: (!) 23  SpO2: 98%   General appearance: cachetic elderly female, resting comfortably  Eyes: No scleral injection Cardiovascular: Regular rate and rhythm without murmurs, rubs, gallops. No edema or variciosities. Distal pulses normal. Pulmonary: Effort normal, non-labored breathing Musculoskeletal:     Muscle tone upper extremities: Normal    Muscle tone lower extremities: Normal    Motor exam: Moves RUE and RLE to command. Grossly normal strength. LUE/LLE flaccid Neurological Mental Status:    - Patient is awake. Nonverbal. Cranial Nerves    - II: PERRL    - III/IV/VI: EOM limited, does track    - V: Facial sensation unable to be assessed    - VII: left facial weakness    - VIII: hearing is intact to voice Sensory: Sensation decreased to LT on left   Results - Imaging/Labs   Results for orders placed or performed during the hospital encounter of 03/12/19 (from the past 48 hour(s))  Glucose, capillary     Status: Abnormal   Collection Time: 03/12/19  1:36 AM  Result Value Ref Range   Glucose-Capillary 133 (H) 70 - 99 mg/dL    Ct Head Wo Contrast  Result Date: 03/11/2019 CLINICAL DATA:  Seizure EXAM: CT HEAD WITHOUT CONTRAST TECHNIQUE: Contiguous axial images were obtained from the base of the skull through the vertex without intravenous contrast.  COMPARISON:  02/04/2019 FINDINGS: Brain: Old right MCA infarct. There Is extra-axial fluid collection of variable density overlying the right frontal and parietal lobes measuring 15 mm in thickness compatible with subdural hematoma. This is predominantly low-density, likely chronic but new since prior study. No hydrocephalus, mass effect or midline shift. Vascular: No hyperdense vessel or unexpected calcification. Skull: No acute calvarial abnormality. Sinuses/Orbits: Visualized paranasal sinuses and mastoids clear. Orbital soft tissues unremarkable. Other: None IMPRESSION: Mixed density 15 mm thick right frontoparietal subdural hematoma, predominantly  low-density suggesting subacute to chronic. No significant mass effect or midline shift. These results were called by telephone at the time of interpretation on 03/11/2019 at 7:27 pm to Dr. Duffy Bruce , who verbally acknowledged these results. Electronically Signed   By: Rolm Baptise M.D.   On: 03/11/2019 19:30   Dg Chest Portable 1 View  Result Date: 03/11/2019 CLINICAL DATA:  Seizure-like activity.  Evaluate for aspiration EXAM: PORTABLE CHEST 1 VIEW COMPARISON:  02/04/2019 FINDINGS: Prior median sternotomy and valve replacement. Cardiomegaly. Pacer wires are unchanged. Aortic atherosclerosis. No overt edema, confluent airspace opacities or effusions. No acute bony abnormality. IMPRESSION: Cardiomegaly.  No active disease. Electronically Signed   By: Rolm Baptise M.D.   On: 03/11/2019 19:49    Impression/Plan   85 y.o. female brought to ED with concerns over status epilepticus, now resolved, who was found to have a right frontoparietal SDH. SDH measures 54mm in thickness. No significant mass effect or midline shift. Patient is reportedly at neurologic baseline. No role for NS intervention. Given age, comorbidity and patient's overall functional status, should she deteriorate, we would rec transition comfort care. Plan for repeat head CT later this am for  monitoring.  Long term anti-coag: Eliquis reversed. SCDs for VTE prophylaxis.   Care discussed with PCCM NP. Patient has been made DNR. Palliative care to be consulted in the am.  Continue current care per PCCM and Neuro.   Ferne Reus, PA-C [facility] [employer]

## 2019-03-12 NOTE — Procedures (Addendum)
ELECTROENCEPHALOGRAM REPORT   Patient: Linda Petersen       Room #: [facility] EEG No. ID: [id number] Age: 85 y.o.        Sex: female Referring Physician: Duwayne Heck Report Date:  03/12/2019        Interpreting Physician: Alexis Goodell  History: [name] is an 85 y.o. female with possible seizure activity  Medications:  Keppra, Synthroid, Pepcid  Conditions of Recording:  This is a 21 channel routine scalp EEG performed with bipolar and monopolar montages arranged in accordance to the international 10/20 system of electrode placement. One channel was dedicated to EKG recording.  The patient is in the altered state.  Description:  The background activity is slow and poorly organized.  It consists of a low voltage mixture of poorly organized theta and delta activity.  This activity is diffusely distributed and continuous but slower with more delta activity noted over the right hemisphere.  Also noted over the right hemisphere is occasional right temporal sharp activity.    Hyperventilation and intermittent photic stimulation were not performed.  IMPRESSION: This EEG is characterized by general background slowing that is focally slow over the right hemisphere with right temporal sharp activity noted consistent with the patient's history of seizures.  Can not rule out a focal abnormality as well.    Alexis Goodell, MD Neurology [phone] 03/12/2019, 10:43 AM

## 2019-03-13 DIAGNOSIS — J9600 Acute respiratory failure, unspecified whether with hypoxia or hypercapnia: Secondary | ICD-10-CM

## 2019-03-13 LAB — LEVETIRACETAM LEVEL: Levetiracetam Lvl: 44.1 ug/mL — ABNORMAL HIGH (ref 10.0–40.0)

## 2019-03-13 MED ORDER — PRAVASTATIN SODIUM 40 MG PO TABS
80.0000 mg | ORAL_TABLET | Freq: Every day | ORAL | Status: DC
  Administered 2019-03-13 – 2019-03-15 (×3): 80 mg via ORAL
  Filled 2019-03-13 (×3): qty 2

## 2019-03-13 MED ORDER — FUROSEMIDE 40 MG PO TABS
40.0000 mg | ORAL_TABLET | Freq: Every day | ORAL | Status: DC
  Administered 2019-03-13 – 2019-03-16 (×4): 40 mg via ORAL
  Filled 2019-03-13 (×4): qty 1

## 2019-03-13 MED ORDER — FAMOTIDINE 20 MG PO TABS
20.0000 mg | ORAL_TABLET | Freq: Two times a day (BID) | ORAL | Status: DC
  Administered 2019-03-13 – 2019-03-16 (×6): 20 mg via ORAL
  Filled 2019-03-13 (×6): qty 1

## 2019-03-13 MED ORDER — LEVOTHYROXINE SODIUM 88 MCG PO TABS
88.0000 ug | ORAL_TABLET | Freq: Every day | ORAL | Status: DC
  Administered 2019-03-14 – 2019-03-16 (×3): 88 ug via ORAL
  Filled 2019-03-13 (×3): qty 1

## 2019-03-13 MED ORDER — ORAL CARE MOUTH RINSE
15.0000 mL | Freq: Two times a day (BID) | OROMUCOSAL | Status: DC
  Administered 2019-03-13 – 2019-03-16 (×5): 15 mL via OROMUCOSAL

## 2019-03-13 MED ORDER — METOPROLOL SUCCINATE ER 25 MG PO TB24
25.0000 mg | ORAL_TABLET | Freq: Every day | ORAL | Status: DC
  Administered 2019-03-13 – 2019-03-16 (×4): 25 mg via ORAL
  Filled 2019-03-13 (×4): qty 1

## 2019-03-13 NOTE — Progress Notes (Signed)
Subjective: Patient resting in bed, comfortable.  Denies headache.  Nurses report no witnessed seizure activity overnight.  Objective: Vital signs in last 24 hours: Vitals:   03/13/19 0300 03/13/19 0400 03/13/19 0500 03/13/19 0600  BP: (!) 128/91 128/70 132/85 127/82  Pulse: 77 77 (!) 59 91  Resp: 19 (!) 27 11 19   Temp:      TempSrc:      SpO2: 100% 100% 100% 100%  Weight:   50.3 kg   Height:        Intake/Output from previous day: 06/29 0701 - 06/30 0700 In: 1938.6 [I.V.:1638.5; IV Piggyback:300] Out: 950 [Urine:950] Intake/Output this shift: No intake/output data recorded.  Physical Exam: Opening eyes spontaneously, following commands.  Oriented to name.  Pupils 3 mm bilaterally, round, reactive to light.  Moving right side spontaneously and to command, left hemiparesis.  CBC Recent Labs    03/11/19 1755 03/12/19 0214  WBC 4.7 4.4  HGB 10.7* 10.8*  HCT 32.2* 31.8*  PLT 170 134*   BMET Recent Labs    03/11/19 1755 03/12/19 0214  NA 141 138  K 3.1* 5.4*  CL 103 103  CO2 22 25  GLUCOSE 129* 130*  BUN 23 16  CREATININE 1.20* 1.06*  CALCIUM 8.8* 8.4*    Studies/Results: Ct Head Wo Contrast  Result Date: 03/12/2019 CLINICAL DATA:  Subdural hematoma. EXAM: CT HEAD WITHOUT CONTRAST TECHNIQUE: Contiguous axial images were obtained from the base of the skull through the vertex without intravenous contrast. COMPARISON:  CT scan of March 11, 2019. FINDINGS: Brain: Stable right frontal subdural hematoma is noted of mixed density. Ventricular size is within normal limits. No significant midline shift is noted. Mild chronic ischemic white matter disease is noted. No acute infarction or mass lesion is noted. Vascular: No hyperdense vessel or unexpected calcification. Skull: Normal. Negative for fracture or focal lesion. Sinuses/Orbits: No acute finding. Other: None. IMPRESSION: Stable right frontal subdural hematoma is noted. Electronically Signed   By: Marijo Conception M.D.    On: 03/12/2019 07:18   Ct Head Wo Contrast  Result Date: 03/11/2019 CLINICAL DATA:  Seizure EXAM: CT HEAD WITHOUT CONTRAST TECHNIQUE: Contiguous axial images were obtained from the base of the skull through the vertex without intravenous contrast. COMPARISON:  02/04/2019 FINDINGS: Brain: Old right MCA infarct. There Is extra-axial fluid collection of variable density overlying the right frontal and parietal lobes measuring 15 mm in thickness compatible with subdural hematoma. This is predominantly low-density, likely chronic but new since prior study. No hydrocephalus, mass effect or midline shift. Vascular: No hyperdense vessel or unexpected calcification. Skull: No acute calvarial abnormality. Sinuses/Orbits: Visualized paranasal sinuses and mastoids clear. Orbital soft tissues unremarkable. Other: None IMPRESSION: Mixed density 15 mm thick right frontoparietal subdural hematoma, predominantly low-density suggesting subacute to chronic. No significant mass effect or midline shift. These results were called by telephone at the time of interpretation on 03/11/2019 at 7:27 pm to Dr. Duffy Bruce , who verbally acknowledged these results. Electronically Signed   By: Rolm Baptise M.D.   On: 03/11/2019 19:30   Dg Chest Port 1 View  Result Date: 03/12/2019 CLINICAL DATA:  Acute respiratory failure. EXAM: PORTABLE CHEST 1 VIEW COMPARISON:  03/11/2019. FINDINGS: Patient is rotated. AICD in stable position. Prior median sternotomy and cardiac valve replacement. Stable cardiomegaly. No pulmonary venous congestion. Low lung volumes with bibasilar atelectasis. No pleural effusion or pneumothorax. IMPRESSION: 1. AICD in stable position. Prior median sternotomy and cardiac valve replacement. Stable cardiomegaly. 2. Low  lung volumes with bibasilar atelectasis again noted. No significant change. Electronically Signed   By: Marcello Moores  Register   On: 03/12/2019 06:52   Dg Chest Portable 1 View  Result Date:  03/11/2019 CLINICAL DATA:  Seizure-like activity.  Evaluate for aspiration EXAM: PORTABLE CHEST 1 VIEW COMPARISON:  02/04/2019 FINDINGS: Prior median sternotomy and valve replacement. Cardiomegaly. Pacer wires are unchanged. Aortic atherosclerosis. No overt edema, confluent airspace opacities or effusions. No acute bony abnormality. IMPRESSION: Cardiomegaly.  No active disease. Electronically Signed   By: Rolm Baptise M.D.   On: 03/11/2019 19:49    Assessment/Plan: Neurologically improved, with increased responsiveness (due to clearing of post ictal state).  Stable from a neurosurgical perspective.  Continued seizure management by neurology service.  Continued supportive care by primary providers.  No role for neurosurgical intervention at this time.  With intracranial hemorrhage, anticoagulation should not be resumed.  No pharmacologic VTE prophylaxis, only mechanical (SCD).  Hosie Spangle, MD 03/13/2019, 7:39 AM

## 2019-03-13 NOTE — Evaluation (Signed)
Clinical/Bedside Swallow Evaluation Patient Details  Name: Linda Petersen MRN: [id number] Date of Birth: 06/17/1928  Today's Date: 03/13/2019 Time: SLP Start Time (ACUTE ONLY): 1025 SLP Stop Time (ACUTE ONLY): 1041 SLP Time Calculation (min) (ACUTE ONLY): 16 min  Past Medical History:  Past Medical History:  Diagnosis Date  . Anemia   . Cancer [facility])    Colon  . Coronary artery disease   . Hypothyroidism   . Presence of permanent cardiac pacemaker   . Seizures ([place])   . Stroke [facility])    left side weakness   Past Surgical History: No past surgical history on file. HPI:  Pt is an 85 year old female with history of right MCA stroke with residual left-sided weakness, presenting with seizures manifesting as left-sided shaking and forced right gaze. PMH also includes CHF, HTN, CKD stage 3, HLD, CAD   Assessment / Plan / Recommendation Clinical Impression  Pt consumed thin liquids and purees with no overt signs of aspiration. Mild L-sided anterior spillage was noted x1 with purees with pt self-correcting with Mod I. More solid textures were held as pt said she did not eat anything more solid without her dentures. Pt's daughter says that family may be able to bring them to the hospital. Will start a pureed diet and thin liquids with use of aspiration precautions. Recommend supervision given pt's mentation. She may be able to upgrade her solids as dentures become available. SLP Visit Diagnosis: Dysphagia, unspecified (R13.10)    Aspiration Risk  Mild aspiration risk    Diet Recommendation Dysphagia 1 (Puree);Thin liquid   Liquid Administration via: Cup;Straw Medication Administration: Whole meds with puree Supervision: Patient able to self feed;Full supervision/cueing for compensatory strategies Compensations: Slow rate;Small sips/bites Postural Changes: Seated upright at 90 degrees    Other  Recommendations Oral Care Recommendations: Oral care BID   Follow up Recommendations 24 hour  supervision/assistance      Frequency and Duration min 2x/week  2 weeks       Prognosis Prognosis for Safe Diet Advancement: Good      Swallow Study   General HPI: Pt is an 85 year old female with history of right MCA stroke with residual left-sided weakness, presenting with seizures manifesting as left-sided shaking and forced right gaze. PMH also includes CHF, HTN, CKD stage 3, HLD, CAD Type of Study: Bedside Swallow Evaluation Previous Swallow Assessment: none in chart Diet Prior to this Study: NPO Temperature Spikes Noted: No Respiratory Status: Room air History of Recent Intubation: No Behavior/Cognition: Alert;Cooperative;Requires cueing Oral Cavity Assessment: Within Functional Limits Oral Care Completed by SLP: No Oral Cavity - Dentition: Edentulous;Dentures, not available Vision: Functional for self-feeding Self-Feeding Abilities: Able to feed self;Needs assist Patient Positioning: Upright in bed Baseline Vocal Quality: Normal Volitional Swallow: Able to elicit    Oral/Motor/Sensory Function Overall Oral Motor/Sensory Function: Mild impairment(mild L weakness - has prior CVA)   Ice Chips Ice chips: Not tested   Thin Liquid Thin Liquid: Within functional limits Presentation: Cup;Self Fed;Straw    Nectar Thick Nectar Thick Liquid: Not tested   Honey Thick Honey Thick Liquid: Not tested   Puree Puree: Impaired Presentation: Self Fed;Spoon Oral Phase Functional Implications: Left anterior spillage   Solid     Solid: Not tested      Venita Sheffield Milcah [place]any 03/13/2019,11:02 AM  Pollyann Glen, M.A. [place] Acute Environmental education officer [phone] Office [phone]

## 2019-03-13 NOTE — Care Management (Addendum)
Received call from [facility] PCP, Dr. Twanna Hy (phone:  [phone]):  She states that son is HCPOA, and most of family wishes to consider placement for patient, but one daughter prefers to take pt home with her without PACE services.  MD states that [facility] would be available to provide comfort meds and [place] aides, if daughter so desires, but she has expressed a distrust with [facility], and wishes to end current services with them.  [facility] would also pay for residential hospice at [place], but again, MD states that daughter will likely be against this as well.    Palliative Care Team to continue to meet with family to discuss goals of care.  Will follow for recommendations.    Reinaldo Raddle, RN, BSN  Trauma/Neuro ICU Case Manager [phone]

## 2019-03-13 NOTE — Progress Notes (Signed)
Report called to Genia Del RN.

## 2019-03-13 NOTE — Progress Notes (Signed)
Palliative:  HPI: 85 y.o. female  with past medical history of CHF, HTN, CKD stage 3, HLD, CAD, h/o R MCA stroke with residual left sided weakness, h/o seizures admitted on 03/12/2019 with concern for seizure with left-sided shaking and forced right gaze and found to have right frontal subdural hematoma. Conservative measures to control seizures in place. No plans for surgical intervention. No plans to restart anticoagulants. Overall prognosis poor d/t declining functional status even prior to seizures. Family denies any falls or possible head trauma. 03/13/19 patient more alert and close to her home baseline.   Ms. Cordoba' is much more alert and interactive. She speaks with me and even laughs with me. She is very tired and just wants to sleep. She endorses that she is not hungry but then ate almost 100% of meal (pureed diet) and RN reports no issues or concerns at this time. She is anticipated to move out of ICU.   I had a conversation with [facility] PCP Dr. Twanna Hy. Dr. Ovid Curd shares that they have been caring for Ms. Southwell for ~3 years now and are very familiar with her there. She understands that majority of family would like to consider placement as her care needs are overwhelming. Her [facility] support had been reduced by family choice in trying to minimize contact with people in the midst of COVID. Dr. Ovid Curd plans to speak with son/HCPOA, Marya Amsler, regarding options for SNF placement, continued [facility]ACE services, and to involve hospice services. She also understands that there is one daughter who wishes to take patient to her home and does not want [facility]ACE involvement (she states that this has happened in the past after an admission as well).   I spoke with Marya Amsler who has had a chance to speak with Dr. Ovid Curd. Marya Amsler tells me that the plan is for his mother to go home with his sister, Festus Holts ([phone]), and that she has her own connections with providers in the [place] system that she plans to utilize to care  for her mother. He points me to [facility] for further decisions at this time since she will be taking over Ms. Tsui' care.   I spoke with Mardene Celeste. Mardene Celeste has very clear goals for her mother. She intends to bring her home with her and will not continue PACE services. She has her own connections she is already in process or arranging. She was present this morning and believes her mother is close to her baseline other than Ms. Mckercher' is typically able to help with transfers and unsure she can perform this at this time. Her main concern is for her mother's pain and arthritis in right knee and requests steroid injection in right knee so that her mother can participate with PT. She does NOT want her mother to have any strong or sedating medications. She had many questions regarding her mother's status and events that have led to hospitalization. We discussed subdural hematoma, seizures (sedating medications to stop seizures), discomfort initially with moaning/crying at beginning of hospitalization leading to pain medication, and stopping Eliquis with risk of bleeding with SDH (with no trauma) but also risk of recurrent stroke. I encouraged her to discuss these risks and plans with her chosen PCP for her mother. She was appreciative of the update and information.   Exam: Alert, lethargic. Confused but appropriate and answers simple questinos. No distress. Breathing regular, unlabored. Abd soft. Generalized weakness.   Plan: - Daughter requesting steroid injection for right knee arthritic pain (last injection while inpatient  at [facility] in 2018 per daughter).  - Will d/c to daughter, Patricia's, home with services of her choosing. Not with [facility] or hospice.  - Avoid sedating medications.   67 min  Vinie Sill, NP Palliative Medicine Team Pager [phone] (Please see [email or web address] for schedule) Team Phone [phone]    Greater than 50%  of this time was spent counseling and coordinating care related  to the above assessment and plan   The above conversation was completed via telephone due to the visitor restrictions during the COVID-19 pandemic. Thorough chart review and discussion with necessary members of the care team was completed as part of assessment.

## 2019-03-13 NOTE — Progress Notes (Addendum)
NAME:  Linda Petersen, MRN:  [id number], DOB:  October 31, 1927, LOS: 1 ADMISSION DATE:  03/12/2019, CONSULTATION DATE:  03/12/2019 REFERRING MD:  Sherral Hammers, EDP, CHIEF COMPLAINT:  SDH    History of present illness   85 year old female presents to ED with witnessed seizure > tremors to left upper and lower extremity along with head "bobbing", and inability to converse. At baseline patient is wheelchair bound, on Keppra and Eliquis. CT Head with acute on chronic SDH 15 mm right frontoparietal SDH. Given Keppra and Ativan without improvement. Loaded with Fosphenytoin. Given Kcentra 2200. Neurosurgery Consulted recommends no need for surgical intervention at this time. Transferred to [facility] for [facility]rology Consult.   Son reports that patient has been declining steadily over the last 2-3 months. Wheelchair/bed bound. Confused and non-verbal at times and then at times will follow commands.   Past Medical History  Seizures, CHF, HTN, HLD, CKD stage 3, Hypothyroidism, Right MCA   Significant Hospital Events   6/28 > Presents to ED 6/29 > Transfer to [place]:  PCCM, Neurology, palliative  Procedures:  N/A  Significant Diagnostic Tests:  CT Head 6/28 > Mixed density 15 mm thick right frontoparietal subdural hematoma, predominantly low-density suggesting subacute to chronic. No significant mass effect or midline shift  EEG 6/29- general background slowing with right temporal activity consistent with history of seizures.  66/29-low lung volumes with bibasilar atelectasis. I have reviewed the images personally  Micro Data:  Blood 6/29 >> U/A 6/29 >>  Antimicrobials:  N/A   Interim history/subjective:    Objective   Blood pressure 138/79, pulse 68, temperature 97.7 F (36.5 C), temperature source Oral, resp. rate (!) 22, height 5\' 2"  (1.575 m), weight 50.3 kg, SpO2 100 %.        Intake/Output Summary (Last 24 hours) at 03/13/2019 1023 Last data filed at 03/13/2019 0600 Gross  per 24 hour  Intake 1796.48 ml  Output 950 ml  Net 846.48 ml   Filed Weights   03/12/19 0135 03/13/19 0500  Weight: 51.3 kg 50.3 kg    Examination: Blood pressure 138/79, pulse 68, temperature 97.7 F (36.5 C), temperature source Oral, resp. rate (!) 22, height 5\' 2"  (1.575 m), weight 50.3 kg, SpO2 100 %. Gen:      No acute distress HEENT:  EOMI, sclera anicteric Neck:     No masses; no thyromegaly Lungs:    Clear to auscultation bilaterally; normal respiratory effort CV:         Regular rate and rhythm; no murmurs Abd:      + bowel sounds; soft, non-tender; no palpable masses, no distension Ext:    No edema; adequate peripheral perfusion Skin:      Warm and dry; no rash Neuro: Awake, confused  Resolved Hospital Problem list     Assessment & Plan:  85 year old with multiple medical issues admitted with seizures, subdural hematoma.  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  A.Fib on Eliquis CAD H/O HTN, HLD, CHF  Plan PRN Lopressor Holding Eliquis Hold Lasix, Metoprolol, Pravastatin until Speech Evaluation   Chronic Kidney Disease Stage 3 (Base Crt 1.2-1.5)  Hypokalemia Plan Follow urine output and creatinine.  Concern for Dysphagia  GERD Plan NPO until speech eval.  Hypothyroidism  Plan Continue Synthroid   Best practice:  Diet: NPO DVT prophylaxis: SCD  GI prophylaxis: PPI Glucose control: Trend glucose  Mobility: Bedrest  Code Status: DNR Family Communication: Son updated on plan of care. Confirms DNR status. Patient has been declining steadily over the last 2-3 months. Wheelchair/bed bound. Confused and non-verbal at times and then at times will follow commands Palliative care on board.  May transition to hospice.  For transfer out of ICU and to hospitalist  service.  PCCM of 7/1.  Marshell Garfinkel MD [facility] Pager [phone] If no answer call [phone] 03/13/2019, 10:24 AM

## 2019-03-13 NOTE — Progress Notes (Signed)
S/O: EEG revealed general background slowing that is focally slow over the right hemisphere. Right temporal sharp activity was noted, consistent with the patient's history of seizures.    BP 138/79   Pulse 68   Temp 97.7 F (36.5 C) (Oral)   Resp (!) 22   Ht 5\' 2"  (1.575 m)   Wt 50.3 kg   SpO2 100%   BMI 20.28 kg/m   HEENT: [place]/AT Lungs: Respirations unlabored  Neurological exam: Ment: Awake. Interacts with examiner with eye contact and attempts to answer questions and follow commands. Decreased attention. Cognitively impaired.  CN: Fixates on examiner's face when asked questions. Will track with saccadic visual pursuits. No nystagmus or forced gaze deviation. Facial asymmetry noted.  Motor: Decreased cooperativity with exam. Moves RUE and RLE to command. Hemiparetic on the left.  Cerebellar/Gait: Unable to assess  No new labs today.   A/R: 85 year old female with history of right MCA stroke with residual left-sided weakness, presenting with seizures manifesting as left-sided shaking and forced right gaze.  1. Seizures resolved. Neurologically improved today, consistent with clearing of post-ictal state. Currently on Keppra 500 mg BID, which should be continued. 2. Etiology for breakthrough seizure most likely multifactorial: subacute subdural hematoma, prior right MCA stroke and toxic/metabolic derangements 3. Palliative care is following. The HCPOA states that she is DNR, would not want invasive or aggressive interventions such as surgery and also would not want a feeding tube or artificial feeding. 4. Neurology will sign off. Please call if there are additional questions.   Electronically signed: Dr. Kerney Elbe

## 2019-03-14 DIAGNOSIS — G8929 Other chronic pain: Secondary | ICD-10-CM

## 2019-03-14 DIAGNOSIS — M25561 Pain in right knee: Secondary | ICD-10-CM

## 2019-03-14 DIAGNOSIS — M25569 Pain in unspecified knee: Secondary | ICD-10-CM

## 2019-03-14 MED ORDER — METHYLPREDNISOLONE ACETATE 80 MG/ML IJ SUSP
80.0000 mg | Freq: Once | INTRAMUSCULAR | Status: AC
  Administered 2019-03-14: 80 mg via INTRA_ARTICULAR
  Filled 2019-03-14: qty 1

## 2019-03-14 MED ORDER — BOOST / RESOURCE BREEZE PO LIQD CUSTOM
1.0000 | Freq: Three times a day (TID) | ORAL | Status: DC
  Administered 2019-03-14 – 2019-03-16 (×5): 1 via ORAL

## 2019-03-14 MED ORDER — BUPIVACAINE HCL (PF) 0.5 % IJ SOLN
10.0000 mL | Freq: Once | INTRAMUSCULAR | Status: AC
  Administered 2019-03-14: 10 mL
  Filled 2019-03-14 (×2): qty 10

## 2019-03-14 MED ORDER — ACETAMINOPHEN 325 MG PO TABS
650.0000 mg | ORAL_TABLET | Freq: Three times a day (TID) | ORAL | Status: DC
  Administered 2019-03-14 – 2019-03-16 (×5): 650 mg via ORAL
  Filled 2019-03-14 (×5): qty 2

## 2019-03-14 MED ORDER — ACETAMINOPHEN 500 MG PO TABS
1000.0000 mg | ORAL_TABLET | Freq: Three times a day (TID) | ORAL | Status: DC
  Administered 2019-03-14: 1000 mg via ORAL
  Filled 2019-03-14: qty 2

## 2019-03-14 NOTE — Progress Notes (Addendum)
Palliative:  Linda Petersen continues to do well considering her overall health conditions. She is in good spirits and confused but oriented to self only (at baseline). She has no complaints but according to RN she does cry out and moan at times. Family have explained that this is from pain in right knee with severe arthritic pain.   I spoke with daughter, Linda Petersen, twice today. HCPOA, Linda Petersen, has pointed me to Linda Petersen for decisions since she will be the primary caregiver upon discharge. Linda Petersen is pleased that her mother was able to have knee injection. We discussed how well her mother has responded as she continues to eat very well (almost 100% of meals even with puree) and she enjoys Boost supplements per RN. She inquires about d/c and I tell her that is not my decision but considering how well her mother is doing I feel this would be soon and possibly even tomorrow.   All questions/concerns addressed. Emotional support provided.   Exam: Alert, oriented to self only. No distress. Good spirits. Able to track and converse. Follows commands.   Plan: - Home with daughter Linda Petersen) when medically ready. No plans for PACE or hospice services upon discharge. Linda Petersen will align provider to take over her mother's care. [facility] health [facility] to be offered to [facility].  - Acetaminophen 650 mg po TID added as discussed with Linda Petersen for chronic right knee arthritis. Continue voltaren gel. Steroid injection completed today as well.   25 min  Linda Sill, NP Palliative Medicine Team Pager [phone] (Please see [email or web address] for schedule) Team Phone [phone]    Greater than 50%  of this time was spent counseling and coordinating care related to the above assessment and plan   The above conversation was completed via telephone due to the visitor restrictions during the COVID-19 pandemic. Thorough chart review and discussion with necessary members of the care team was completed as part of assessment.

## 2019-03-14 NOTE — Progress Notes (Signed)
  Speech Language Pathology Treatment: Dysphagia  Patient Details Name: Leiloni Smithers MRN: [id number] DOB: 1928/06/21 Today's Date: 03/14/2019 Time: 0300-9233 SLP Time Calculation (min) (ACUTE ONLY): 11 min  Assessment / Plan / Recommendation Clinical Impression  Pt self-fed thin liquids and purees, at times with a fast rate, but without overt s/s of aspiration. She says that she still prefers the purees because she does not yet have her dentures in the hospital. Her plate from lunch was completely cleared. Will continue current diet for now, but anticipate readiness to advance solids once her dentures become available, whether this be during her hospital stay or after discharge.    HPI HPI: Pt is an 85 year old female with history of right MCA stroke with residual left-sided weakness, presenting with seizures manifesting as left-sided shaking and forced right gaze. PMH also includes CHF, HTN, CKD stage 3, HLD, CAD      SLP Plan  Continue with current plan of care       Recommendations  Diet recommendations: Dysphagia 1 (puree);Thin liquid Liquids provided via: Cup;Straw Medication Administration: Whole meds with puree Supervision: Staff to assist with self feeding;Full supervision/cueing for compensatory strategies Compensations: Slow rate;Small sips/bites Postural Changes and/or Swallow Maneuvers: Seated upright 90 degrees                Oral Care Recommendations: Oral care BID Follow up Recommendations: 24 hour supervision/assistance SLP Visit Diagnosis: Dysphagia, unspecified (R13.10) Plan: Continue with current plan of care       GO                Venita Sheffield Rether Rison 03/14/2019, 3:12 PM  Pollyann Glen, M.A. [place] Acute Environmental education officer [phone] Office [phone]

## 2019-03-14 NOTE — Progress Notes (Signed)
CM spoke to Dr Ovid Curd at [facility] and she is going to fax a copy of the HCPOA to the unit in the am.  CM spoke to pts son about the request to change the HCPOA. Pt is only alert and oriented to herself. She is not able to make the decision to change the HCPOA at this time. Son is aware and expressed understanding.  TOC following.

## 2019-03-14 NOTE — Progress Notes (Signed)
PROGRESS NOTE    Linda Petersen  ERD:[id number] DOB: 1928-01-03 DOA: 03/12/2019 PCP: [place] Narrative:85 year old female presents to ED with witnessed seizure > tremors to left upper and lower extremity along with head "bobbing", and inability to converse. At baseline patient is wheelchair bound, on Keppra and Eliquis. CT Head with acute on chronic SDH 15 mm right frontoparietal SDH. Given Keppra and Ativan without improvement. Loaded with Fosphenytoin. Given Kcentra 2200. Neurosurgery Consulted recommends no need for surgical intervention at this time, repeat Head CT this AM. Transferred to [facility] for [facility]rology Consult.   Son reports that patient has been declining steadily over the last 2-3 months. Wheelchair/bed bound. Confused and non-verbal at times and then at times will follow commands.   Assessment & Plan:   Active Problems:   Seizures (HCC)   Goals of care, counseling/discussion   Palliative care encounter   Acute respiratory failure ([place])   #1 seizures in the setting of his chronic to subacute subdural hematoma with history of right MCA stroke-patient was admitted to the ICU treated with fosphenytoin and Kcentra and Keppra and Ativan.  Patient was seen by neurology and neurosurgery.  Both of them have signed off.  Neurology recommends to continue Keppra avoid anticoagulation.  Patient was on Eliquis for A. fib prior to admission.  Patient seen by palliative care,daughter wants to take the patient home  #2 A. fib Eliquis stopped due to subdural hematoma.  Continue rate control with Lopressor.  3 CKD stage III stable  #4 hypothyroidism continue synthroid  #5 dysphagia seen by speech therapy  #6 right knee pain patient's daughter reports she gets her knee injected at [facility] and is requesting her knee injected with steroids while in hospital. Ortho consulted  dvt prophylaxis -SCD CODE STATUS DNR Family communication discussed with son Disposition  plan discharge tomorrow watch overnight for breakthrough seizures if stable overnight discharge tomorrow home with daughter  Estimated body mass index is 21.85 kg/m as calculated from the following:   Height as of this encounter: 5\' 2"  (1.575 m).   Weight as of this encounter: 54.2 kg.    Subjective: C/o right knee pain no further seizures reported on iv keppra  Objective: Vitals:   03/14/19 0441 03/14/19 0500 03/14/19 0751 03/14/19 1011  BP: (!) 112/58  115/68 122/67  Pulse: 71  72 64  Resp: 16  16   Temp: 98.4 F (36.9 C)  97.8 F (36.6 C)   TempSrc: Oral     SpO2: 100%  100%   Weight:  54.2 kg    Height:        Intake/Output Summary (Last 24 hours) at 03/14/2019 1218 Last data filed at 03/14/2019 1008 Gross per 24 hour  Intake 1230.52 ml  Output -  Net 1230.52 ml   Filed Weights   03/12/19 0135 03/13/19 0500 03/14/19 0500  Weight: 51.3 kg 50.3 kg 54.2 kg    Examination:  General exam: Appears calm and comfortable  Respiratory system: Clear to auscultation. Respiratory effort normal. Cardiovascular system: S1 & S2 heard, RRR. No JVD, murmurs, rubs, gallops or clicks. No pedal edema. Gastrointestinal system: Abdomen is nondistended, soft and nontender. No organomegaly or masses felt. Normal bowel sounds heard. Central nervous system: Alert and oriented. No focal neurological deficits. Extremities: decreased rom both lower extremity due to pain Skin: No rashes, lesions or ulcers Psychiatry: Judgement and insight appear normal. Mood & affect appropriate.     Data Reviewed: I have  personally reviewed following labs and imaging studies  CBC: Recent Labs  Lab 03/11/19 1755 03/12/19 0214  WBC 4.7 4.4  HGB 10.7* 10.8*  HCT 32.2* 31.8*  MCV 93.1 91.6  PLT 170 469*   Basic Metabolic Panel: Recent Labs  Lab 03/11/19 1755 03/12/19 0214  NA 141 138  K 3.1* 5.4*  CL 103 103  CO2 22 25  GLUCOSE 129* 130*  BUN 23 16  CREATININE 1.20* 1.06*  CALCIUM 8.8* 8.4*   MG  --  2.1  PHOS  --  2.5   GFR: Estimated Creatinine Clearance: 28.5 mL/min (A) (by C-G formula based on SCr of 1.06 mg/dL (H)). Liver Function Tests: Recent Labs  Lab 03/11/19 1755  AST 34  ALT 16  ALKPHOS 76  BILITOT 0.8  PROT 7.6  ALBUMIN 3.9   No results for input(s): LIPASE, AMYLASE in the last 168 hours. No results for input(s): AMMONIA in the last 168 hours. Coagulation Profile: Recent Labs  Lab 03/11/19 2341  INR 1.1   Cardiac Enzymes: No results for input(s): CKTOTAL, CKMB, CKMBINDEX, TROPONINI in the last 168 hours. BNP (last 3 results) No results for input(s): PROBNP in the last 8760 hours. HbA1C: No results for input(s): HGBA1C in the last 72 hours. CBG: Recent Labs  Lab 03/12/19 0136  GLUCAP 133*   Lipid Profile: No results for input(s): CHOL, HDL, LDLCALC, TRIG, CHOLHDL, LDLDIRECT in the last 72 hours. Thyroid Function Tests: No results for input(s): TSH, T4TOTAL, FREET4, T3FREE, THYROIDAB in the last 72 hours. Anemia Panel: No results for input(s): VITAMINB12, FOLATE, FERRITIN, TIBC, IRON, RETICCTPCT in the last 72 hours. Sepsis Labs: Recent Labs  Lab 03/12/19 0343  PROCALCITON <0.10    Recent Results (from the past 240 hour(s))  SARS Coronavirus 2 (CEPHEID - Performed in [facility] hospital lab), Hosp Order     Status: None   Collection Time: 03/11/19  9:37 PM   Specimen: Nasopharyngeal Swab  Result Value Ref Range Status   SARS Coronavirus 2 NEGATIVE NEGATIVE Final    Comment: (NOTE) If result is NEGATIVE SARS-CoV-2 target nucleic acids are NOT DETECTED. The SARS-CoV-2 RNA is generally detectable in upper and lower  respiratory specimens during the acute phase of infection. The lowest  concentration of SARS-CoV-2 viral copies this assay can detect is 250  copies / mL. A negative result does not preclude SARS-CoV-2 infection  and should not be used as the sole basis for treatment or other  patient management decisions.  A negative  result may occur with  improper specimen collection / handling, submission of specimen other  than nasopharyngeal swab, presence of viral mutation(s) within the  areas targeted by this assay, and inadequate number of viral copies  (<250 copies / mL). A negative result must be combined with clinical  observations, patient history, and epidemiological information. If result is POSITIVE SARS-CoV-2 target nucleic acids are DETECTED. The SARS-CoV-2 RNA is generally detectable in upper and lower  respiratory specimens dur ing the acute phase of infection.  Positive  results are indicative of active infection with SARS-CoV-2.  Clinical  correlation with patient history and other diagnostic information is  necessary to determine patient infection status.  Positive results do  not rule out bacterial infection or co-infection with other viruses. If result is PRESUMPTIVE POSTIVE SARS-CoV-2 nucleic acids MAY BE PRESENT.   A presumptive positive result was obtained on the submitted specimen  and confirmed on repeat testing.  While 2019 novel coronavirus  (SARS-CoV-2) nucleic acids  may be present in the submitted sample  additional confirmatory testing may be necessary for epidemiological  and / or clinical management purposes  to differentiate between  SARS-CoV-2 and other Sarbecovirus currently known to infect humans.  If clinically indicated additional testing with an alternate test  methodology [phone]) is advised. The SARS-CoV-2 RNA is generally  detectable in upper and lower respiratory sp ecimens during the acute  phase of infection. The expected result is Negative. Fact Sheet for Patients:  StrictlyIdeas.no Fact Sheet for Healthcare Providers: BankingDealers.co.za This test is not yet approved or cleared by the [place] FDA and has been authorized for detection and/or diagnosis of SARS-CoV-2 by FDA under an Emergency Use Authorization  (EUA).  This EUA will remain in effect (meaning this test can be used) for the duration of the COVID-19 declaration under Section 564(b)(1) of the Act, 21 U.S.C. section 360bbb-3(b)(1), unless the authorization is terminated or revoked sooner. Performed at [facility], [address] [place], [place] [address]   MRSA PCR Screening     Status: None   Collection Time: 03/12/19  1:43 AM   Specimen: Nasal Mucosa; Nasopharyngeal  Result Value Ref Range Status   MRSA by PCR NEGATIVE NEGATIVE Final    Comment:        The GeneXpert MRSA Assay (FDA approved for NASAL specimens only), is one component of a comprehensive MRSA colonization surveillance program. It is not intended to diagnose MRSA infection nor to guide or monitor treatment for MRSA infections. Performed at [facility], [place] [address] [place], [place] [address]   Culture, blood (routine x 2)     Status: None (Preliminary result)   Collection Time: 03/12/19  2:14 AM   Specimen: BLOOD LEFT HAND  Result Value Ref Range Status   Specimen Description BLOOD LEFT HAND  Final   Special Requests   Final    BOTTLES DRAWN AEROBIC ONLY Blood Culture adequate volume   Culture   Final    NO GROWTH 2 DAYS Performed at [facility], [place] [address], [place], [place] [address]    Report Status PENDING  Incomplete  Culture, blood (routine x 2)     Status: None (Preliminary result)   Collection Time: 03/12/19  2:14 AM   Specimen: BLOOD LEFT HAND  Result Value Ref Range Status   Specimen Description BLOOD LEFT HAND  Final   Special Requests   Final    BOTTLES DRAWN AEROBIC ONLY Blood Culture adequate volume   Culture   Final    NO GROWTH 2 DAYS Performed at [facility], [place] [address], [place], [place] [address]    Report Status PENDING  Incomplete         Radiology Studies: No results found.      Scheduled Meds: . bupivacaine  10 mL Infiltration Once  . Chlorhexidine Gluconate Cloth  6  each Topical Q0600  . diclofenac sodium  2 g Topical QID  . famotidine  20 mg Oral BID  . furosemide  40 mg Oral Daily  . levothyroxine  88 mcg Oral Q0600  . mouth rinse  15 mL Mouth Rinse BID  . methylPREDNISolone acetate  80 mg Intra-articular Once  . metoprolol succinate  25 mg Oral Daily  . pravastatin  80 mg Oral q1800   Continuous Infusions: . dextrose 5 % and 0.45% NaCl 75 mL/hr at 03/14/19 0700  . levETIRAcetam 500 mg (03/14/19 1008)     LOS: 2 days  Georgette Shell, MD [facility] If 7PM-7AM, please contact night-coverage [email or web address] Password [facility] 03/14/2019, 12:18 PM

## 2019-03-14 NOTE — Plan of Care (Signed)
  Problem: Safety: Goal: Ability to remain free from injury will improve Outcome: Progressing   

## 2019-03-14 NOTE — Procedures (Addendum)
Procedure: Right knee injection  Indication: Right knee pain  Surgeon: Silvestre Gunner, PA-C  Assist: None  Anesthesia: None  EBL: None  Complications: None  Findings: Asked to provide knee joint steroid injection by family/medical team to help with pain and mobilization. She has had her knee injected before, the last time about 2 years ago. After risks/benefits explained patient's family desires to undergo procedure. Consent obtained and time out performed. The right knee was sterilely prepped and 14ml 0.5% Marcaine and 80mg  depomedrol instilled once synovial fluid aspirated. Pt tolerated the procedure well.    Lisette Abu, PA-C Orthopedic Surgery [phone]

## 2019-03-15 DIAGNOSIS — J9601 Acute respiratory failure with hypoxia: Secondary | ICD-10-CM

## 2019-03-15 MED ORDER — LEVETIRACETAM 500 MG PO TABS
500.0000 mg | ORAL_TABLET | Freq: Two times a day (BID) | ORAL | Status: DC
  Administered 2019-03-15 – 2019-03-16 (×2): 500 mg via ORAL
  Filled 2019-03-15 (×2): qty 1

## 2019-03-15 MED ORDER — DICLOFENAC SODIUM 1 % TD GEL: 2.0000 g | Freq: Four times a day (QID) | TRANSDERMAL | 1 refills | Status: AC

## 2019-03-15 NOTE — Consult Note (Addendum)
Reason for Consult:Right knee pain Referring Physician: [name] is an 85 y.o. female.  HPI: Linda Petersen was admitted 2/2 seizure d/o. While here she endorsed pain in her right knee that made it difficult for her to participate in PT. The family notes a long hx/o OA in that knee that has been amenable to steroid injections in the past through the [place] system. She last had an injection about 2 years ago. Family requested another injection to help facilitate her recovery and orthopedic surgery was consulted.  Past Medical History:  Diagnosis Date  . Anemia   . Cancer [facility])    Colon  . Coronary artery disease   . Hypothyroidism   . Presence of permanent cardiac pacemaker   . Seizures ([place])   . Stroke [facility])    left side weakness    No past surgical history on file.  No family history on file.  Social History:  reports that she has never smoked. She has quit using smokeless tobacco. She reports previous alcohol use. No history on file for drug.  Allergies:  Allergies  Allergen Reactions  . Oxycodone     Unknown reaction.    Medications: I have reviewed the patient's current medications.  No results found for this or any previous visit (from the past 48 hour(s)).  No results found.  Review of Systems  Constitutional: Negative for weight loss.  HENT: Negative for ear discharge, ear pain, hearing loss and tinnitus.   Eyes: Negative for blurred vision, double vision, photophobia and pain.  Respiratory: Negative for cough, sputum production and shortness of breath.   Cardiovascular: Negative for chest pain.  Gastrointestinal: Negative for abdominal pain, nausea and vomiting.  Genitourinary: Negative for dysuria, flank pain, frequency and urgency.  Musculoskeletal: Positive for joint pain (Right knee). Negative for back pain, falls, myalgias and neck pain.  Neurological: Negative for dizziness, tingling, sensory change, focal weakness, loss of consciousness and headaches.   Endo/Heme/Allergies: Does not bruise/bleed easily.  Psychiatric/Behavioral: Negative for depression, memory loss and substance abuse. The patient is not nervous/anxious.    Blood pressure 122/60, pulse 72, temperature 98.3 F (36.8 C), temperature source Axillary, resp. rate 12, height 5\' 2"  (1.575 m), weight 54.2 kg, SpO2 96 %. Physical Exam  Constitutional: She appears well-developed and well-nourished. No distress.  HENT:  Head: Normocephalic and atraumatic.  Eyes: Conjunctivae are normal. Right eye exhibits no discharge. Left eye exhibits no discharge. No scleral icterus.  Neck: Normal range of motion.  Cardiovascular: Normal rate and regular rhythm.  Respiratory: Effort normal. No respiratory distress.  Musculoskeletal:     Comments: RLE No traumatic wounds, ecchymosis, or rash  Mild diffuse knee TTP, pain with AROM, pain with PROM with extension past 135, difficult for pt to categorize what she's feeling.  No knee or ankle effusion  Knee stable to varus/ valgus and anterior/posterior stress  Sens DPN, SPN, TN intact  Motor EHL, ext, flex, evers 5/5  DP 1+, PT 1+, No significant edema  Neurological: She is alert.  Skin: Skin is warm and dry. She is not diaphoretic.  Psychiatric: She has a normal mood and affect. Her behavior is normal.    Assessment/Plan: Right knee pain -- Will inject knee with Marcaine and depomedrol. She should f/u with her established orthopedist if that's in alignment with her goals of care.    Lisette Abu, PA-C Orthopedic Surgery [phone] 03/15/2019, 9:03 AM   Reviewed, discussed, and agree with above.   Vonna Kotyk P  Mardelle Matte, MD

## 2019-03-15 NOTE — Evaluation (Signed)
Physical Therapy Evaluation Patient Details Name: Linda Petersen MRN: [id number] DOB: December 27, 1927 Today's Date: 03/15/2019   History of Present Illness  Pt is an 85 y/o female admitted secondary to seizure like activity. Pt also with R knee pain and received steriod injection during admission. PMH includes CAD, colon cancer, seizures, CVA with L sided weakness.   Clinical Impression  Pt admitted secondary to problem above with deficits below. Performed standing transfers X5 this session. Pt able to clear hips, but unable to achieve full upright with max A. Spoke with daughter following session and she reports she plans to take pt home and is interested in [place] services. Feel hoyer lift would be safest option when transferring pt. Will continue to follow acutely to maximize functional mobility independence and safety.     Follow Up Recommendations Home health PT;Supervision/Assistance - 24 hour(HHOT)    Equipment Recommendations  Other (comment)(hoyer lift, hoyer lift pad)    Recommendations for Other Services OT consult     Precautions / Restrictions Precautions Precautions: Fall Restrictions Weight Bearing Restrictions: No      Mobility  Bed Mobility Overal bed mobility: Needs Assistance Bed Mobility: Supine to Sit;Sit to Supine     Supine to sit: Mod assist Sit to supine: Max assist   General bed mobility comments: Mod A for LE assist and trunk elevation. Pt able to assist using RUE. Max A for return to supine.   Transfers Overall transfer level: Needs assistance Equipment used: 1 person hand held assist Transfers: Sit to/from Stand Sit to Stand: Max assist         General transfer comment: Pt attempted standing X5. Was able to clear hips from bed surface, but unable to achieve full upright. Unsafe to attempt further transfer with +1 assist.   Ambulation/Gait                Stairs            Wheelchair Mobility    Modified Rankin (Stroke Patients  Only)       Balance Overall balance assessment: Needs assistance Sitting-balance support: No upper extremity supported;Feet supported Sitting balance-Leahy Scale: Fair     Standing balance support: Single extremity supported Standing balance-Leahy Scale: Zero Standing balance comment: max A to perform partial stand                             Pertinent Vitals/Pain Pain Assessment: Faces Faces Pain Scale: No hurt    Home Living Family/patient expects to be discharged to:: Private residence Living Arrangements: Children Available Help at Discharge: Family;Available 24 hours/day Type of Home: House Home Access: Ramped entrance     Home Layout: One level Home Equipment: Wheelchair - manual;Bedside commode;Tub bench      Prior Function Level of Independence: Needs assistance   Gait / Transfers Assistance Needed: Pt requires assist for transfers to and from [facility]. Per daughter, sometimes requires heavy assistance.   ADL's / Homemaking Assistance Needed: Pt required assist for transfers to tub bench and into shower. Pt's daughter reports they sometimes will sponge bathe pt.         Hand Dominance        Extremity/Trunk Assessment   Upper Extremity Assessment Upper Extremity Assessment: Defer to OT evaluation(LUE weakness at baseline)    Lower Extremity Assessment Lower Extremity Assessment: Generalized weakness;LLE deficits/detail;RLE deficits/detail RLE Deficits / Details: Pt with hx of R knee pain, however, did not seem to bother  pt during transfers.  LLE Deficits / Details: LLE weakness at baseline secondary to CVA.     Cervical / Trunk Assessment Cervical / Trunk Assessment: Kyphotic  Communication   Communication: No difficulties  Cognition Arousal/Alertness: Awake/alert Behavior During Therapy: WFL for tasks assessed/performed Overall Cognitive Status: History of cognitive impairments - at baseline                                         General Comments General comments (skin integrity, edema, etc.): Spoke with daughter about DME recommendations.     Exercises     Assessment/Plan    PT Assessment Patient needs continued PT services  PT Problem List Decreased strength;Decreased balance;Decreased mobility;Decreased cognition;Decreased knowledge of use of DME;Decreased safety awareness;Decreased knowledge of precautions       PT Treatment Interventions Functional mobility training;Therapeutic activities;Therapeutic exercise;Balance training;Patient/family education;Cognitive remediation    PT Goals (Current goals can be found in the Care Plan section)  Acute Rehab PT Goals Patient Stated Goal: For pt to return home per daughter PT Goal Formulation: With patient Time For Goal Achievement: 03/29/19 Potential to Achieve Goals: Good    Frequency Min 3X/week   Barriers to discharge        Co-evaluation               AM-PAC PT "6 Clicks" Mobility  Outcome Measure Help needed turning from your back to your side while in a flat bed without using bedrails?: A Lot Help needed moving from lying on your back to sitting on the side of a flat bed without using bedrails?: A Lot Help needed moving to and from a bed to a chair (including a wheelchair)?: Total Help needed standing up from a chair using your arms (e.g., wheelchair or bedside chair)?: Total Help needed to walk in hospital room?: Total Help needed climbing 3-5 steps with a railing? : Total 6 Click Score: 8    End of Session Equipment Utilized During Treatment: Gait belt Activity Tolerance: Patient tolerated treatment well Patient left: in bed;with call bell/phone within reach;with bed alarm set(in chair position) Nurse Communication: Mobility status;Need for lift equipment PT Visit Diagnosis: Difficulty in walking, not elsewhere classified (R26.2);Muscle weakness (generalized) (M62.81)    Time: 1021-1173 PT Time Calculation (min) (ACUTE  ONLY): 25 min   Charges:   PT Evaluation $PT Eval Moderate Complexity: 1 Mod PT Treatments $Therapeutic Activity: 8-22 mins        Leighton Ruff, PT, DPT  Acute Rehabilitation Services  Pager: [phone] Office: [phone]   Rudean Hitt 03/15/2019, 2:02 PM

## 2019-03-15 NOTE — TOC Transition Note (Addendum)
Transition of Care [facility]) - CM/SW Discharge Note   Patient Details  Name: Linda Petersen MRN: [id number] Date of Birth: 1928/03/01  [facility] CM/SW Contact:  Linda Favre, RN Phone Number: 03/15/2019, 11:47 AM   Clinical Narrative:     Received an order for home health PT.   Called son Linda Petersen [phone] , Linda Petersen deferred all decision making to his sister Linda Petersen [phone].   Spoke with Linda Petersen via phone. Linda Petersen is planning on taking her mother home with her at discharge. Discharge is today.   Linda Petersen requesting MD to call her and bedside nurse to call her. She wants to know if her mother is back at baseline which is standing up and requires assistance to transfer to wheel chair or commode.   Discussed home health PT. Ms Risk has a friend Linda Petersen who is a PT at an orthopedic center, however, daughter states Linda Petersen can come to her home to work with patient.   Sent MD message and spoke to bedside nurse.  Requested order for hoyer lift    Barriers to Discharge: No Barriers Identified   Patient Goals and CMS Choice     Choice offered to / list presented to : Adult Children  Discharge Placement                       Discharge Plan and Services                DME Arranged: N/A         HH Arranged: Refused HH          Social Determinants of Health (SDOH) Interventions     Readmission Risk Interventions No flowsheet data found.

## 2019-03-15 NOTE — Progress Notes (Signed)
Noted some dark blood mixed with pt.s stool this am when given peri care. Pt may be having some vaginal bleed which may not be active. MD to reevaluate pt this am. before pt get discharge. Marland Kitchen

## 2019-03-15 NOTE — Discharge Summary (Addendum)
Physician Discharge Summary  Linda Petersen SPQ:[id number] DOB: Apr 24, 1928 DOA: 03/12/2019  PCP: [place] date: 03/12/2019 Discharge date: 03/16/2019 Admitted From: Home Disposition: Nursing home  Recommendations for Outpatient Follow-up:  1. Follow up with PCP in 1-2 weeks 2. Please obtain BMP/CBC in one week  Home Health yes Equipment/Devices none  Discharge Condition: Stable and improved CODE STATUS: DO NOT RESUSCITATE Diet recommendation cardiac Brief/Interim Summary:85 year old female presents to ED with witnessed seizure > tremors to left upper and lower extremity along with head "bobbing", and inability to converse. At baseline patient is wheelchair bound, on Keppra and Eliquis. CT Head with acute on chronic SDH 15 mm right frontoparietal SDH. Given Keppra and Ativan without improvement. Loaded with Fosphenytoin. Given Kcentra 2200. Neurosurgery Consulted recommends no need for surgical intervention at this time, repeat Head CT this AM. Transferred to [facility] for [facility]rology Consult.  Son reports that patient has been declining steadily over the last 2-3 months. Wheelchair/bed bound. Confused and non-verbal at times and then at times will follow commands.   Discharge Diagnoses:  Active Problems:   Seizures ([place])   Goals of care, counseling/discussion   Palliative care encounter   Acute respiratory failure (HCC)   Knee pain  #1 breakthrough seizures in a patient with history of seizure disorder which is thought to be secondary to sub-acute subdural hematoma and history of stroke.  She was admitted to the ICU treated with fosphenytoin and Kcentra Ativan and Keppra IV.  She was seen in consultation by neurology and neurosurgery.  Neurologist recommended to continue Keppra 500 twice daily.  I picked up the patient on 03/15/2019.  Keppra was continued at 500 mg twice a day with no recurrent seizures.  CT head shows stable right frontal subdural hematoma.   Patient was seen by palliative care.  Daughter expressed a desire to take her home with her.  #2 right knee osteoarthritis status post knee injected with steroids by Ortho patient denies any knee pain today.  #3 history of atrial fibrillation continue Lopressor Eliquis stopped due to subdural hematoma  #4 hypothyroidism continue Synthroid.  #5 history of dysphagia by PT recommended.  Thin liquid diet dysphagia 1 diet   Estimated body mass index is 21.85 kg/m as calculated from the following:   Height as of this encounter: 5\' 2"  (1.575 m).   Weight as of this encounter: 54.2 kg.  Discharge Instructions  Discharge Instructions    Call MD for:  difficulty breathing, headache or visual disturbances   Complete by: As directed    Call MD for:  persistant nausea and vomiting   Complete by: As directed    Call MD for:  severe uncontrolled pain   Complete by: As directed    Diet - low sodium heart healthy   Complete by: As directed    Increase activity slowly   Complete by: As directed      Allergies as of 03/15/2019      Reactions   Oxycodone    Unknown reaction.      Medication List    STOP taking these medications   apixaban 2.5 MG Tabs tablet Commonly known as: ELIQUIS   cefdinir 300 MG capsule Commonly known as: OMNICEF   furosemide 40 MG tablet Commonly known as: LASIX   potassium chloride 10 MEQ tablet Commonly known as: K-DUR     TAKE these medications   acetaminophen 500 MG tablet Commonly known as: TYLENOL Take 500 mg by mouth every  4 (four) hours as needed for mild pain or moderate pain.   diclofenac sodium 1 % Gel Commonly known as: VOLTAREN Apply 2 g topically 4 (four) times daily.   famotidine 20 MG tablet Commonly known as: PEPCID Take 20 mg by mouth 2 (two) times daily.   ferrous sulfate 325 (65 FE) MG tablet Take 325 mg by mouth every Monday, Wednesday, and Friday.   levETIRAcetam 100 MG/ML solution Commonly known as: KEPPRA Take 5 mLs by  mouth every 12 (twelve) hours.   levothyroxine 88 MCG tablet Commonly known as: SYNTHROID Take 88 mcg by mouth daily before breakfast.   metoprolol succinate 25 MG 24 hr tablet Commonly known as: TOPROL-XL Take 25 mg by mouth daily.   pravastatin 80 MG tablet Commonly known as: PRAVACHOL Take 80 mg by mouth at bedtime.      Follow-up [facility] Follow up.   Contact information: [address] [place] [address] [phone]          Allergies  Allergen Reactions  . Oxycodone     Unknown reaction.    Consultations:  Neurology neurosurgery and PCCM   Procedures/Studies: Ct Head Wo Contrast  Result Date: 03/12/2019 CLINICAL DATA:  Subdural hematoma. EXAM: CT HEAD WITHOUT CONTRAST TECHNIQUE: Contiguous axial images were obtained from the base of the skull through the vertex without intravenous contrast. COMPARISON:  CT scan of March 11, 2019. FINDINGS: Brain: Stable right frontal subdural hematoma is noted of mixed density. Ventricular size is within normal limits. No significant midline shift is noted. Mild chronic ischemic white matter disease is noted. No acute infarction or mass lesion is noted. Vascular: No hyperdense vessel or unexpected calcification. Skull: Normal. Negative for fracture or focal lesion. Sinuses/Orbits: No acute finding. Other: None. IMPRESSION: Stable right frontal subdural hematoma is noted. Electronically Signed   By: Marijo Conception M.D.   On: 03/12/2019 07:18   Ct Head Wo Contrast  Result Date: 03/11/2019 CLINICAL DATA:  Seizure EXAM: CT HEAD WITHOUT CONTRAST TECHNIQUE: Contiguous axial images were obtained from the base of the skull through the vertex without intravenous contrast. COMPARISON:  02/04/2019 FINDINGS: Brain: Old right MCA infarct. There Is extra-axial fluid collection of variable density overlying the right frontal and parietal lobes measuring 15 mm in thickness compatible with subdural hematoma.  This is predominantly low-density, likely chronic but new since prior study. No hydrocephalus, mass effect or midline shift. Vascular: No hyperdense vessel or unexpected calcification. Skull: No acute calvarial abnormality. Sinuses/Orbits: Visualized paranasal sinuses and mastoids clear. Orbital soft tissues unremarkable. Other: None IMPRESSION: Mixed density 15 mm thick right frontoparietal subdural hematoma, predominantly low-density suggesting subacute to chronic. No significant mass effect or midline shift. These results were called by telephone at the time of interpretation on 03/11/2019 at 7:27 pm to Dr. Duffy Bruce , who verbally acknowledged these results. Electronically Signed   By: Rolm Baptise M.D.   On: 03/11/2019 19:30   Dg Chest Port 1 View  Result Date: 03/12/2019 CLINICAL DATA:  Acute respiratory failure. EXAM: PORTABLE CHEST 1 VIEW COMPARISON:  03/11/2019. FINDINGS: Patient is rotated. AICD in stable position. Prior median sternotomy and cardiac valve replacement. Stable cardiomegaly. No pulmonary venous congestion. Low lung volumes with bibasilar atelectasis. No pleural effusion or pneumothorax. IMPRESSION: 1. AICD in stable position. Prior median sternotomy and cardiac valve replacement. Stable cardiomegaly. 2. Low lung volumes with bibasilar atelectasis again noted. No significant change. Electronically Signed   By: Marcello Moores  Register  On: 03/12/2019 06:52   Dg Chest Portable 1 View  Result Date: 03/11/2019 CLINICAL DATA:  Seizure-like activity.  Evaluate for aspiration EXAM: PORTABLE CHEST 1 VIEW COMPARISON:  02/04/2019 FINDINGS: Prior median sternotomy and valve replacement. Cardiomegaly. Pacer wires are unchanged. Aortic atherosclerosis. No overt edema, confluent airspace opacities or effusions. No acute bony abnormality. IMPRESSION: Cardiomegaly.  No active disease. Electronically Signed   By: Rolm Baptise M.D.   On: 03/11/2019 19:49   (Echo, Carotid, EGD, Colonoscopy, ERCP)     Subjective: Awake resting in bed denies any knee pain anxious to go home  Discharge Exam: Vitals:   03/15/19 0831 03/15/19 0833  BP: 122/60   Pulse:  72  Resp: 12   Temp: 98.3 F (36.8 C)   SpO2: 96%    Vitals:   03/14/19 2346 03/15/19 0342 03/15/19 0831 03/15/19 0833  BP: 126/60 122/73 122/60   Pulse: 86 88  72  Resp: 18 18 12    Temp: 98.3 F (36.8 C) 98.1 F (36.7 C) 98.3 F (36.8 C)   TempSrc: Oral Oral Axillary   SpO2: 96% 98% 96%   Weight:      Height:        General: Pt is alert, awake, not in acute distress Cardiovascular: RRR, S1/S2 +, no rubs, no gallops Respiratory: CTA bilaterally, no wheezing, no rhonchi Abdominal: Soft, NT, ND, bowel sounds + Extremities: no edema, no cyanosis    The results of significant diagnostics from this hospitalization (including imaging, microbiology, ancillary and laboratory) are listed below for reference.     Microbiology: Recent Results (from the past 240 hour(s))  SARS Coronavirus 2 (CEPHEID - Performed in [facility] hospital lab), Hosp Order     Status: None   Collection Time: 03/11/19  9:37 PM   Specimen: Nasopharyngeal Swab  Result Value Ref Range Status   SARS Coronavirus 2 NEGATIVE NEGATIVE Final    Comment: (NOTE) If result is NEGATIVE SARS-CoV-2 target nucleic acids are NOT DETECTED. The SARS-CoV-2 RNA is generally detectable in upper and lower  respiratory specimens during the acute phase of infection. The lowest  concentration of SARS-CoV-2 viral copies this assay can detect is 250  copies / mL. A negative result does not preclude SARS-CoV-2 infection  and should not be used as the sole basis for treatment or other  patient management decisions.  A negative result may occur with  improper specimen collection / handling, submission of specimen other  than nasopharyngeal swab, presence of viral mutation(s) within the  areas targeted by this assay, and inadequate number of viral copies  (<250 copies /  mL). A negative result must be combined with clinical  observations, patient history, and epidemiological information. If result is POSITIVE SARS-CoV-2 target nucleic acids are DETECTED. The SARS-CoV-2 RNA is generally detectable in upper and lower  respiratory specimens dur ing the acute phase of infection.  Positive  results are indicative of active infection with SARS-CoV-2.  Clinical  correlation with patient history and other diagnostic information is  necessary to determine patient infection status.  Positive results do  not rule out bacterial infection or co-infection with other viruses. If result is PRESUMPTIVE POSTIVE SARS-CoV-2 nucleic acids MAY BE PRESENT.   A presumptive positive result was obtained on the submitted specimen  and confirmed on repeat testing.  While 2019 novel coronavirus  (SARS-CoV-2) nucleic acids may be present in the submitted sample  additional confirmatory testing may be necessary for epidemiological  and / or clinical management purposes  to differentiate between  SARS-CoV-2 and other Sarbecovirus currently known to infect humans.  If clinically indicated additional testing with an alternate test  methodology [phone]) is advised. The SARS-CoV-2 RNA is generally  detectable in upper and lower respiratory sp ecimens during the acute  phase of infection. The expected result is Negative. Fact Sheet for Patients:  StrictlyIdeas.no Fact Sheet for Healthcare Providers: BankingDealers.co.za This test is not yet approved or cleared by the [place] FDA and has been authorized for detection and/or diagnosis of SARS-CoV-2 by FDA under an Emergency Use Authorization (EUA).  This EUA will remain in effect (meaning this test can be used) for the duration of the COVID-19 declaration under Section 564(b)(1) of the Act, 21 U.S.C. section 360bbb-3(b)(1), unless the authorization is terminated or revoked  sooner. Performed at [facility], [address] [place], [place] [address]   MRSA PCR Screening     Status: None   Collection Time: 03/12/19  1:43 AM   Specimen: Nasal Mucosa; Nasopharyngeal  Result Value Ref Range Status   MRSA by PCR NEGATIVE NEGATIVE Final    Comment:        The GeneXpert MRSA Assay (FDA approved for NASAL specimens only), is one component of a comprehensive MRSA colonization surveillance program. It is not intended to diagnose MRSA infection nor to guide or monitor treatment for MRSA infections. Performed at [facility], [place] [address], [place], [place] [address]   Culture, blood (routine x 2)     Status: None (Preliminary result)   Collection Time: 03/12/19  2:14 AM   Specimen: BLOOD LEFT HAND  Result Value Ref Range Status   Specimen Description BLOOD LEFT HAND  Final   Special Requests   Final    BOTTLES DRAWN AEROBIC ONLY Blood Culture adequate volume   Culture   Final    NO GROWTH 3 DAYS Performed at [facility], [place] [address], [place], [place] [address]    Report Status PENDING  Incomplete  Culture, blood (routine x 2)     Status: None (Preliminary result)   Collection Time: 03/12/19  2:14 AM   Specimen: BLOOD LEFT HAND  Result Value Ref Range Status   Specimen Description BLOOD LEFT HAND  Final   Special Requests   Final    BOTTLES DRAWN AEROBIC ONLY Blood Culture adequate volume   Culture   Final    NO GROWTH 3 DAYS Performed at [facility], [place] [address] [place], [place] [address]    Report Status PENDING  Incomplete     Labs: BNP (last 3 results) No results for input(s): BNP in the last 8760 hours. Basic Metabolic Panel: Recent Labs  Lab 03/11/19 1755 03/12/19 0214  NA 141 138  K 3.1* 5.4*  CL 103 103  CO2 22 25  GLUCOSE 129* 130*  BUN 23 16  CREATININE 1.20* 1.06*  CALCIUM 8.8* 8.4*  MG  --  2.1  PHOS  --  2.5   Liver Function Tests: Recent Labs  Lab 03/11/19 1755  AST 34  ALT  16  ALKPHOS 76  BILITOT 0.8  PROT 7.6  ALBUMIN 3.9   No results for input(s): LIPASE, AMYLASE in the last 168 hours. No results for input(s): AMMONIA in the last 168 hours. CBC: Recent Labs  Lab 03/11/19 1755 03/12/19 0214  WBC 4.7 4.4  HGB 10.7* 10.8*  HCT 32.2* 31.8*  MCV 93.1 91.6  PLT 170 134*   Cardiac Enzymes: No  results for input(s): CKTOTAL, CKMB, CKMBINDEX, TROPONINI in the last 168 hours. BNP: Invalid input(s): POCBNP CBG: Recent Labs  Lab 03/12/19 0136  GLUCAP 133*   D-Dimer No results for input(s): DDIMER in the last 72 hours. Hgb A1c No results for input(s): HGBA1C in the last 72 hours. Lipid Profile No results for input(s): CHOL, HDL, LDLCALC, TRIG, CHOLHDL, LDLDIRECT in the last 72 hours. Thyroid function studies No results for input(s): TSH, T4TOTAL, T3FREE, THYROIDAB in the last 72 hours.  Invalid input(s): FREET3 Anemia work up No results for input(s): VITAMINB12, FOLATE, FERRITIN, TIBC, IRON, RETICCTPCT in the last 72 hours. Urinalysis    Component Value Date/Time   COLORURINE STRAW (A) 03/12/2019 0212   APPEARANCEUR CLEAR 03/12/2019 0212   LABSPEC 1.009 03/12/2019 0212   PHURINE 7.0 03/12/2019 0212   GLUCOSEU NEGATIVE 03/12/2019 0212   HGBUR NEGATIVE 03/12/2019 0212   BILIRUBINUR NEGATIVE 03/12/2019 0212   KETONESUR NEGATIVE 03/12/2019 0212   PROTEINUR 30 (A) 03/12/2019 0212   NITRITE NEGATIVE 03/12/2019 0212   LEUKOCYTESUR NEGATIVE 03/12/2019 0212   Sepsis Labs Invalid input(s): PROCALCITONIN,  WBC,  LACTICIDVEN Microbiology Recent Results (from the past 240 hour(s))  SARS Coronavirus 2 (CEPHEID - Performed in [facility] hospital lab), Hosp Order     Status: None   Collection Time: 03/11/19  9:37 PM   Specimen: Nasopharyngeal Swab  Result Value Ref Range Status   SARS Coronavirus 2 NEGATIVE NEGATIVE Final    Comment: (NOTE) If result is NEGATIVE SARS-CoV-2 target nucleic acids are NOT DETECTED. The SARS-CoV-2 RNA is generally  detectable in upper and lower  respiratory specimens during the acute phase of infection. The lowest  concentration of SARS-CoV-2 viral copies this assay can detect is 250  copies / mL. A negative result does not preclude SARS-CoV-2 infection  and should not be used as the sole basis for treatment or other  patient management decisions.  A negative result may occur with  improper specimen collection / handling, submission of specimen other  than nasopharyngeal swab, presence of viral mutation(s) within the  areas targeted by this assay, and inadequate number of viral copies  (<250 copies / mL). A negative result must be combined with clinical  observations, patient history, and epidemiological information. If result is POSITIVE SARS-CoV-2 target nucleic acids are DETECTED. The SARS-CoV-2 RNA is generally detectable in upper and lower  respiratory specimens dur ing the acute phase of infection.  Positive  results are indicative of active infection with SARS-CoV-2.  Clinical  correlation with patient history and other diagnostic information is  necessary to determine patient infection status.  Positive results do  not rule out bacterial infection or co-infection with other viruses. If result is PRESUMPTIVE POSTIVE SARS-CoV-2 nucleic acids MAY BE PRESENT.   A presumptive positive result was obtained on the submitted specimen  and confirmed on repeat testing.  While 2019 novel coronavirus  (SARS-CoV-2) nucleic acids may be present in the submitted sample  additional confirmatory testing may be necessary for epidemiological  and / or clinical management purposes  to differentiate between  SARS-CoV-2 and other Sarbecovirus currently known to infect humans.  If clinically indicated additional testing with an alternate test  methodology [phone]) is advised. The SARS-CoV-2 RNA is generally  detectable in upper and lower respiratory sp ecimens during the acute  phase of infection. The  expected result is Negative. Fact Sheet for Patients:  StrictlyIdeas.no Fact Sheet for Healthcare Providers: BankingDealers.co.za This test is not yet approved or cleared by the  [place] Architectural technologist and has been authorized for detection and/or diagnosis of SARS-CoV-2 by FDA under an Print production planner (EUA).  This EUA will remain in effect (meaning this test can be used) for the duration of the COVID-19 declaration under Section 564(b)(1) of the Act, 21 U.S.C. section 360bbb-3(b)(1), unless the authorization is terminated or revoked sooner. Performed at [facility], [address] [place], [place] [address]   MRSA PCR Screening     Status: None   Collection Time: 03/12/19  1:43 AM   Specimen: Nasal Mucosa; Nasopharyngeal  Result Value Ref Range Status   MRSA by PCR NEGATIVE NEGATIVE Final    Comment:        The GeneXpert MRSA Assay (FDA approved for NASAL specimens only), is one component of a comprehensive MRSA colonization surveillance program. It is not intended to diagnose MRSA infection nor to guide or monitor treatment for MRSA infections. Performed at [facility], [place] [address] [place], [place] [address]   Culture, blood (routine x 2)     Status: None (Preliminary result)   Collection Time: 03/12/19  2:14 AM   Specimen: BLOOD LEFT HAND  Result Value Ref Range Status   Specimen Description BLOOD LEFT HAND  Final   Special Requests   Final    BOTTLES DRAWN AEROBIC ONLY Blood Culture adequate volume   Culture   Final    NO GROWTH 3 DAYS Performed at [facility], [place] [address], [place], [place] [address]    Report Status PENDING  Incomplete  Culture, blood (routine x 2)     Status: None (Preliminary result)   Collection Time: 03/12/19  2:14 AM   Specimen: BLOOD LEFT HAND  Result Value Ref Range Status   Specimen Description BLOOD LEFT HAND  Final   Special Requests   Final    BOTTLES  DRAWN AEROBIC ONLY Blood Culture adequate volume   Culture   Final    NO GROWTH 3 DAYS Performed at [facility], [place] [address], [place], [place] [address]    Report Status PENDING  Incomplete     Time coordinating discharge:33 minutes  SIGNED:   Georgette Shell, MD  [facility] 03/15/2019, 9:48 AM Pager   If 7PM-7AM, please contact night-coverage [email or web address] Password [email or web address]

## 2019-03-15 NOTE — Progress Notes (Signed)
Palliative Medicine RN Note: Daughter Linda Petersen [phone]) asking about speaking with pt's attending MD, as well as stating she needs plenty of notice before d/c, as she lives out of town. She also states she will not take pt home until she has at least stood up with the staff, as she is concerned pt will have BP or other medical problems with movement & would like to get that cleared before she leaves [facility]. I paged Dr Rodena Piety to request she call Linda Petersen, as well as contacting RNCM to let them know of Linda Petersen's request.  Linda Petersen. Linda Hegner, RN, BSN, [facility] Palliative Medicine Team 03/15/2019 11:32 AM Office [phone]

## 2019-03-16 MED ORDER — TRAMADOL HCL 50 MG PO TABS
50.0000 mg | ORAL_TABLET | Freq: Once | ORAL | Status: AC
  Administered 2019-03-16: 50 mg via ORAL
  Filled 2019-03-16: qty 1

## 2019-03-16 NOTE — Evaluation (Signed)
Occupational Therapy Evaluation Patient Details Name: Linda Petersen MRN: [id number] DOB: [birth year] Today's Date: 03/16/2019    History of Present Illness Pt is an 85 y/o female admitted secondary to seizure like activity. Pt also with R knee pain and received steriod injection during admission. PMH includes CAD, colon cancer, seizures, CVA with L sided weakness.    Clinical Impression   Pt PTA: living at home with daughter, assist for most ADL. Pt requires heavy assist/maxA for sit to stand, but unable to stand fully upright. Attempt x2 times with maxA. Pt totalA to scoot towards HOB. Pt not interested in grooming, but washed face with set-upA. LUE weak from previous CVA. PT pointing to R knee due to pain with bed mobility. Pt continues to require maxA to [place] for ADL at bed level. Pt would greatly benefit from continued OT in next venue of care for education for family. OT signing off.    Follow Up Recommendations  Home health OT;Supervision - Intermittent    Equipment Recommendations  None recommended by OT    Recommendations for Other Services       Precautions / Restrictions Precautions Precautions: Fall Restrictions Weight Bearing Restrictions: No      Mobility Bed Mobility Overal bed mobility: Needs Assistance Bed Mobility: Supine to Sit     Supine to sit: Mod assist     General bed mobility comments: Mod A for LE assist and trunk elevation. Pt able to assist using RUE.   Transfers Overall transfer level: Needs assistance Equipment used: 1 person hand held assist Transfers: Sit to/from Stand Sit to Stand: Max assist         General transfer comment: Pt unable to clear hips for full sit to stand    Balance Overall balance assessment: Needs assistance   Sitting balance-Leahy Scale: Fair     Standing balance support: Single extremity supported Standing balance-Leahy Scale: Zero                             ADL either performed or assessed  with clinical judgement   ADL Overall ADL's : At baseline                                       General ADL Comments: requires assist for all ADL     Vision Baseline Vision/History: Wears glasses Wears Glasses: At all times Vision Assessment?: No apparent visual deficits     Perception     Praxis      Pertinent Vitals/Pain Pain Assessment: Faces Faces Pain Scale: Hurts a little bit Pain Location: Pain in RLE knee with movement Pain Descriptors / Indicators: Grimacing;Moaning     Hand Dominance     Extremity/Trunk Assessment Upper Extremity Assessment Upper Extremity Assessment: Generalized weakness LUE Deficits / Details: s/p previous CVA LUE Coordination: decreased fine motor;decreased gross motor   Lower Extremity Assessment Lower Extremity Assessment: Defer to PT evaluation   Cervical / Trunk Assessment Cervical / Trunk Assessment: Kyphotic   Communication Communication Communication: No difficulties   Cognition Arousal/Alertness: Awake/alert Behavior During Therapy: WFL for tasks assessed/performed Overall Cognitive Status: History of cognitive impairments - at baseline                                 General Comments: Followed all commands.  Pt was following the weather on TV repeatiing July 4th weather   General Comments  No dizziness reported in standing    Exercises     Shoulder Instructions      Home Living Family/patient expects to be discharged to:: Private residence Living Arrangements: Children Available Help at Discharge: Family;Available 24 hours/day Type of Home: House Home Access: Ramped entrance     Home Layout: One level     Bathroom Shower/Tub: Tub/shower unit         Home Equipment: Wheelchair - manual;Bedside commode;Tub bench          Prior Functioning/Environment Level of Independence: Needs assistance  Gait / Transfers Assistance Needed: Pt requires assist for transfers to and from [facility].  Per daughter, sometimes requires heavy assistance.  ADL's / Homemaking Assistance Needed: Pt required assist for transfers to tub bench and into shower. Pt's daughter reports they sometimes will sponge bathe pt.             OT Problem List: Decreased strength;Decreased activity tolerance;Impaired balance (sitting and/or standing);Decreased coordination;Decreased safety awareness      OT Treatment/Interventions:      OT Goals(Current goals can be found in the care plan section) Acute Rehab OT Goals Patient Stated Goal: For pt to return home per daughter OT Goal Formulation: With patient/family  OT Frequency:     Barriers to D/C:            Co-evaluation              AM-PAC OT "6 Clicks" Daily Activity     Outcome Measure Help from another person eating meals?: A Lot Help from another person taking care of personal grooming?: A Lot Help from another person toileting, which includes using toliet, bedpan, or urinal?: Total Help from another person bathing (including washing, rinsing, drying)?: Total Help from another person to put on and taking off regular upper body clothing?: A Lot Help from another person to put on and taking off regular lower body clothing?: Total 6 Click Score: 9   End of Session Equipment Utilized During Treatment: Gait belt Nurse Communication: Mobility status  Activity Tolerance: Patient limited by fatigue Patient left: in bed;with call bell/phone within reach;with bed alarm set  OT Visit Diagnosis: Unsteadiness on feet (R26.81);Muscle weakness (generalized) (M62.81)                Time: 1308-6578 OT Time Calculation (min): 14 min Charges:  OT General Charges $OT Visit: 1 Visit OT Evaluation $OT Eval Low Complexity: 1 Low  Darryl Nestle) Marsa Aris OTR/L Acute Rehabilitation Services Pager: [phone] Office: [place] 03/16/2019, 3:31 PM

## 2019-03-16 NOTE — Progress Notes (Signed)
Discharge instructions (including medications) discussed with and copy provided to patient/caregiver 

## 2019-03-16 NOTE — Care Management Important Message (Signed)
Important Message  Patient Details  Name: Linda Petersen MRN: [id number] Date of Birth: [birth year]   Medicare Important Message Given:  Yes   Verbal consent  Given.  [name] 03/16/2019, 2:27 PM

## 2019-03-16 NOTE — Discharge Instructions (Signed)
Acute Knee Pain, Adult Many things can cause knee pain. Sometimes, knee pain is sudden (acute) and may be caused by damage, swelling, or irritation of the muscles and tissues that support your knee. The pain often goes away on its own with time and rest. If the pain does not go away, tests may be done to find out what is causing the pain. Follow these instructions at home: Pay attention to any changes in your symptoms. Take these actions to relieve your pain. If you have a knee sleeve or brace:   Wear the sleeve or brace as told by your doctor. Remove it only as told by your doctor.  Loosen the sleeve or brace if your toes: ? Tingle. ? Become numb. ? Turn cold and blue.  Keep the sleeve or brace clean.  If the sleeve or brace is not waterproof: ? Do not let it get wet. ? Cover it with a watertight covering when you take a bath or shower. Activity  Rest your knee.  Do not do things that cause pain.  Avoid activities where both feet leave the ground at the same time (high-impact activities). Examples are running, jumping rope, and doing jumping jacks.  Work with a physical therapist to make a safe exercise program, as told by your doctor. Managing pain, stiffness, and swelling   If told, put ice on the knee: ? Put ice in a plastic bag. ? Place a towel between your skin and the bag. ? Leave the ice on for 20 minutes, 2-3 times a day.  If told, put pressure (compression) on your injured knee to control swelling, give support, and help with discomfort. Compression may be done with an elastic bandage. General instructions  Take all medicines only as told by your doctor.  Raise (elevate) your knee while you are sitting or lying down. Make sure your knee is higher than your heart.  Sleep with a pillow under your knee.  Do not use any products that contain nicotine or tobacco. These include cigarettes, e-cigarettes, and chewing tobacco. These products may slow down healing. If  you need help quitting, ask your doctor.  If you are overweight, work with your doctor and a food expert (dietitian) to set goals to lose weight. Being overweight can make your knee hurt more.  Keep all follow-up visits as told by your doctor. This is important. Contact a doctor if:  The knee pain does not stop.  The knee pain changes or gets worse.  You have a fever along with knee pain.  Your knee feels warm when you touch it.  Your knee gives out or locks up. Get help right away if:  Your knee swells, and the swelling gets worse.  You cannot move your knee.  You have very bad knee pain. Summary  Many things can cause knee pain. The pain often goes away on its own with time and rest.  Your doctor may do tests to find out the cause of the pain.  Pay attention to any changes in your symptoms. Relieve your pain with rest, medicines, light activity, and use of ice.  Get help right away if you cannot move your knee or your knee pain is very bad. This information is not intended to replace advice given to you by your health care provider. Make sure you discuss any questions you have with your health care provider. Document Released: 11/26/2008 Document Revised: 02/09/2018 Document Reviewed: 02/09/2018 Elsevier Patient Education  2020 [employer].  Chronic Knee Pain, Adult Chronic knee pain is pain in one or both knees that lasts longer than 3 months. Symptoms of chronic knee pain may include swelling, stiffness, and discomfort. Age-related wear and tear (osteoarthritis) of the knee joint is the most common cause of chronic knee pain. Other possible causes include:  A long-term immune-related disease that causes inflammation of the knee (rheumatoid arthritis). This usually affects both knees.  Inflammatory arthritis, such as gout or pseudogout.  An injury to the knee that causes arthritis.  An injury to the knee that damages the ligaments. Ligaments are strong tissues that  connect bones to each other.  Runner's knee or pain behind the kneecap. Treatment for chronic knee pain depends on the cause. The main treatments for chronic knee pain are physical therapy and weight loss. This condition may also be treated with medicines, injections, a knee sleeve or brace, and by using crutches. Rest, ice, compression (pressure), and elevation (RICE) therapy may also be recommended. Follow these instructions at home: If you have a knee sleeve or brace:   Wear it as told by your health care provider. Remove it only as told by your health care provider.  Loosen it if your toes tingle, become numb, or turn cold and blue.  Keep it clean.  If the sleeve or brace is not waterproof: ? Do not let it get wet. ? Remove it if allowed by your health care provider, or cover it with a watertight covering when you take a bath or a shower. Managing pain, stiffness, and swelling      If directed, apply heat to the affected area as often as told by your health care provider. Use the heat source that your health care provider recommends, such as a moist heat pack or a heating pad. ? If you have a removable sleeve or brace, remove it as told by your health care provider. ? Place a towel between your skin and the heat source. ? Leave the heat on for 20-30 minutes. ? Remove the heat if your skin turns bright red. This is especially important if you are unable to feel pain, heat, or cold. You may have a greater risk of getting burned.  If directed, put ice on the affected area. ? If you have a removable sleeve or brace, remove it as told by your health care provider. ? Put ice in a plastic bag. ? Place a towel between your skin and the bag. ? Leave the ice on for 20 minutes, 2-3 times a day.  Move your toes often to reduce stiffness and swelling.  Raise (elevate) the injured area above the level of your heart while you are sitting or lying down. Activity  Avoid activities where  both feet leave the ground at the same time (high-impact activities). Examples are running, jumping rope, and doing jumping jacks.  Return to your normal activities as told by your health care provider. Ask your health care provider what activities are safe for you.  Follow the exercise plan that your health care provider designed for you. Your health care provider may suggest that you: ? Avoid activities that make knee pain worse. This may require you to change your exercise routines, sport participation, or job duties. ? Wear shoes with cushioned soles. ? Avoid high-impact activities or sports that require running and sudden changes in direction. ? Do physical therapy as told by your health care provider. Physical therapy is planned to match your needs and abilities. It may  include exercises for strength, flexibility, stability, and endurance. ? Do exercises that increase balance and strength, such as tai chi and yoga.  Do not use the injured limb to support your body weight until your health care provider says that you can. Use crutches, a cane, or a walker, as told by your health care provider. General instructions  Take over-the-counter and prescription medicines only as told by your health care provider.  Lose weight if you are overweight. Losing even a little weight can reduce knee pain. Ask your health care provider what your ideal weight is, and how to safely lose extra weight. A food expert (dietitian) may be able to help you plan your meals.  Do not use any products that contain nicotine or tobacco, such as cigarettes, e-cigarettes, and chewing tobacco. These can delay healing. If you need help quitting, ask your health care provider.  Keep all follow-up visits as told by your health care provider. This is important. Contact a health care provider if:  You have knee pain that is not getting better or gets worse.  You are unable to do your physical therapy exercises due to knee  pain. Get help right away if:  Your knee swells and the swelling becomes worse.  You cannot move your knee.  You have severe knee pain. Summary  Knee pain that lasts more than 3 months is considered chronic knee pain.  The main treatments for chronic knee pain are physical therapy and weight loss. You may also need to take medicines, wear a knee sleeve or brace, use crutches, and apply ice or heat.  Losing even a little weight can reduce knee pain. Ask your health care provider what your ideal weight is, and how to safely lose extra weight. A food expert (dietitian) may be able to help you plan your meals.  Work with a physical therapist to make a safe exercise program, as told by your health care provider. This information is not intended to replace advice given to you by your health care provider. Make sure you discuss any questions you have with your health care provider. Document Released: 11/09/2018 Document Revised: 11/09/2018 Document Reviewed: 11/09/2018 Elsevier Patient Education  [place] therapy can help ease sore, stiff, injured, and tight muscles and joints. Heat relaxes your muscles, which may help ease your pain and muscle spasms. Do not use heat therapy unless your doctor tells you to use it. How to use heat therapy There are several different kinds of heat therapy, including:  Moist heat pack.  Hot water bottle.  Electric heating pad.  Heated gel pack.  Heated wrap.  Warm water bath. Your doctor will tell you how to use heat therapy. In general, you should: 1. Place a towel between your skin and the heat source. 2. Leave the heat on for 20-30 minutes. Your skin may turn pink. 3. Remove the heat if your skin turns bright red. You should remove the heat source if you are unable to feel pain, heat, or cold. You are more likely to get burned if you leave it on the skin for too long. Your doctor may also tell you to take a warm  water bath. To do this: 1. Put a non-slip pad in the bathtub to prevent a fall. 2. Fill the bathtub with warm water. 3. Check the water temperature. 4. Soak in the water for 15-20 minutes, or as told by your doctor. 5. Be careful when you stand  up after the bath. You may feel dizzy. 6. Pat yourself dry after the bath. Do not rub your skin to dry it. General recommendations for heat therapy  Do not sleep while using heat therapy. Only use heat therapy while you are awake.  Your skin may turn pink while using heat therapy. Do not use heat therapy if your skin turns red.  Do not use heat therapy if you have a new injury.  High heat or using heat for a long time can cause burns. Be careful not to burn your skin when using heat therapy.  Do not use heat therapy on areas of your skin that are already irritated, such as with a rash or sunburn. Get help if you have:  Blisters, redness, swelling (puffiness), or numbness.  New pain.  Pain that is getting worse. Summary  Heat therapy is the use of heat to help ease sore, stiff, injured, and tight muscles and joints.  There are different types of heat therapy. Your doctor will tell you which one to use.  Only use heat therapy while you are awake.  Watch your skin to make sure you do not get burned while using heat therapy. This information is not intended to replace advice given to you by your health care provider. Make sure you discuss any questions you have with your health care provider. Document Released: 11/22/2011 Document Revised: 10/23/2018 Document Reviewed: 09/10/2017 Elsevier Patient Education  2020 [employer].

## 2019-03-16 NOTE — Progress Notes (Signed)
Pt moaning  C/o rt knee pain. Was reposition with no relief. MD on call notified . New order received and tramadol 50 mg po given with some relief noted. Pt resting quietly now.  RN will continue to monitor Sherlynn Stalls. Verdene Lennert RN.

## 2019-03-16 NOTE — TOC Transition Note (Signed)
Transition of Care [facility]) - CM/SW Discharge Note   Patient Details  Name: Linda Petersen MRN: [id number] Date of Birth: 02/26/1928  [facility] CM/SW Contact:  Pollie Friar, RN Phone Number: 03/16/2019, 11:08 AM   Clinical Narrative:    CM spoke to daughter and she plans of providing transportation home today. She asked to have the orders for [facility] PT and for the hoyer lift. CM printed them and placed in d/c packet. Will update the bedside RN.   Final next level of care: Home/Self Care Barriers to Discharge: No Barriers Identified   Patient Goals and CMS Choice     Choice offered to / list presented to : Adult Children  Discharge Placement                       Discharge Plan and Services                DME Arranged: Other see comment(hoyer lift orders)         HH Arranged: PT          Social Determinants of Health (SDOH) Interventions     Readmission Risk Interventions No flowsheet data found.

## 2019-03-16 NOTE — Plan of Care (Signed)
  Problem: Education: Goal: Knowledge of General Education information will improve Description: Including pain rating scale, medication(s)/side effects and non-pharmacologic comfort measures Outcome: Adequate for Discharge   Problem: Health Behavior/Discharge Planning: Goal: Ability to manage health-related needs will improve Outcome: Adequate for Discharge   Problem: Clinical Measurements: Goal: Will remain free from infection Outcome: Adequate for Discharge Goal: Diagnostic test results will improve Outcome: Adequate for Discharge Goal: Cardiovascular complication will be avoided Outcome: Adequate for Discharge   Problem: Activity: Goal: Risk for activity intolerance will decrease Outcome: Adequate for Discharge   Problem: Elimination: Goal: Will not experience complications related to bowel motility Outcome: Adequate for Discharge   Problem: Pain Managment: Goal: General experience of comfort will improve Outcome: Adequate for Discharge   Problem: Safety: Goal: Ability to remain free from injury will improve Outcome: Adequate for Discharge   Problem: Skin Integrity: Goal: Risk for impaired skin integrity will decrease Outcome: Adequate for Discharge   Problem: Acute Rehab PT Goals(only PT should resolve) Goal: Pt Will Go Supine/Side To Sit Outcome: Adequate for Discharge Goal: Pt Will Go Sit To Supine/Side Outcome: Adequate for Discharge Goal: Patient Will Transfer Sit To/From Stand Outcome: Adequate for Discharge Goal: Pt Will Transfer Bed To Chair/Chair To Bed Outcome: Adequate for Discharge

## 2019-03-16 NOTE — Progress Notes (Signed)
Physical Therapy Treatment Patient Details Name: Linda Petersen MRN: [id number] DOB: [birth year] Today's Date: 03/16/2019    History of Present Illness Pt is an 85 y/o female admitted secondary to seizure like activity. Pt also with R knee pain and received steriod injection during admission. PMH includes CAD, colon cancer, seizures, CVA with L sided weakness.     PT Comments    Continuing work on functional mobility and activity tolerance;  Able to transfer OOB to recliner with Max assist and use of bed pad for squat pivot transfer; VSS, though unable to obtain a BP standing; continue to agree with mechanical lift for home and HHPT/OT follow up   Follow Up Recommendations  Home health PT;Supervision/Assistance - 24 hour(HHOT)     Equipment Recommendations  Other (comment)(hoyer lift, hoyer lift pad)    Recommendations for Other Services       Precautions / Restrictions Precautions Precautions: Fall    Mobility  Bed Mobility Overal bed mobility: Needs Assistance Bed Mobility: Supine to Sit     Supine to sit: Mod assist     General bed mobility comments: Mod A for LE assist and trunk elevation. Pt able to assist using RUE.   Transfers Overall transfer level: Needs assistance Equipment used: 1 person hand held assist(and bed pad to cradle hips) Transfers: Sit to/from [employer] Sit to Stand: Max assist   Squat pivot transfers: Max assist     General transfer comment: Similarly to last PT session, able to clear hips but unable to stand fully upright; Assisted to chair using bedbad to cradle hips with squat pivot transfer OOB to chair, knees loosely blocked for stability  Ambulation/Gait                 Stairs             Wheelchair Mobility    Modified Rankin (Stroke Patients Only)       Balance     Sitting balance-Leahy Scale: Fair       Standing balance-Leahy Scale: Zero                              Cognition  Arousal/Alertness: Awake/alert Behavior During Therapy: WFL for tasks assessed/performed Overall Cognitive Status: History of cognitive impairments - at baseline                                        Exercises      General Comments General comments (skin integrity, edema, etc.): Performed partial orthostatics (unable to get standing BP), VSS; See vitals flow sheet.       Pertinent Vitals/Pain Pain Assessment: Faces Faces Pain Scale: Hurts a little bit Pain Location: Pain in RLE knee with movement Pain Descriptors / Indicators: Grimacing;Moaning Pain Intervention(s): Monitored during session    Home Living                      Prior Function            PT Goals (current goals can now be found in the care plan section) Acute Rehab PT Goals Patient Stated Goal: For pt to return home per daughter PT Goal Formulation: With patient Time For Goal Achievement: 03/29/19 Potential to Achieve Goals: Good Progress towards PT goals: Progressing toward goals(Slowly)    Frequency    Min 3X/week  PT Plan Current plan remains appropriate    Co-evaluation              AM-PAC PT "6 Clicks" Mobility   Outcome Measure  Help needed turning from your back to your side while in a flat bed without using bedrails?: A Little Help needed moving from lying on your back to sitting on the side of a flat bed without using bedrails?: A Lot Help needed moving to and from a bed to a chair (including a wheelchair)?: A Lot Help needed standing up from a chair using your arms (e.g., wheelchair or bedside chair)?: A Lot Help needed to walk in hospital room?: Total Help needed climbing 3-5 steps with a railing? : Total 6 Click Score: 11    End of Session   Activity Tolerance: Patient tolerated treatment well Patient left: in chair;with call bell/phone within reach;with chair alarm set Nurse Communication: Mobility status PT Visit Diagnosis: Difficulty in  walking, not elsewhere classified (R26.2);Muscle weakness (generalized) (M62.81)     Time: 0034-9179 PT Time Calculation (min) (ACUTE ONLY): 31 min  Charges:  $Therapeutic Activity: 23-37 mins                     Roney Marion, PT  Acute Rehabilitation Services Pager [phone] Office [place] 03/16/2019, 11:35 AM

## 2019-03-17 LAB — CULTURE, BLOOD (ROUTINE X 2)
Culture: NO GROWTH
Culture: NO GROWTH
Special Requests: ADEQUATE
Special Requests: ADEQUATE

## 2019-03-19 ENCOUNTER — Telehealth: Payer: Self-pay

## 2019-03-19 NOTE — Telephone Encounter (Signed)
Palliative Medicine RN Note: Rec'd a call from Mardene Celeste re concerns about pt's functional status at the time of d/c and is asking for PMT NP Vinie Sill to call her back. Elmo Putt is off-service right now; I will leave her a message asking her to call Pat back. I did explain that, as inpatient providers, PMT has limited access to charts after a patient discharges and that we are unable to write orders for patients who are not actively admitted. Pat verbalized understanding.  Marjie Skiff Arnelle Nale, RN, BSN, [facility] Palliative Medicine Team 03/19/2019 9:39 AM Office [phone]

## 2019-08-14 DEATH — deceased

## 2020-07-15 IMAGING — CT CT HEAD WITHOUT CONTRAST
4 series · 17 of 47 positions shown, 19 images · non-contrast
Comparison: CT scan of March 11, 2019.

CLINICAL DATA: Subdural hematoma.

EXAM:
CT HEAD WITHOUT CONTRAST
TECHNIQUE: Contiguous axial images were obtained from the base of the skull
through the vertex without intravenous contrast.

[Series 3: head wo · axial · 0.41mm/px · z∈[-112,+8]mm · 7 of 33 slices shown, 9 images]
[im 5/33  brain]
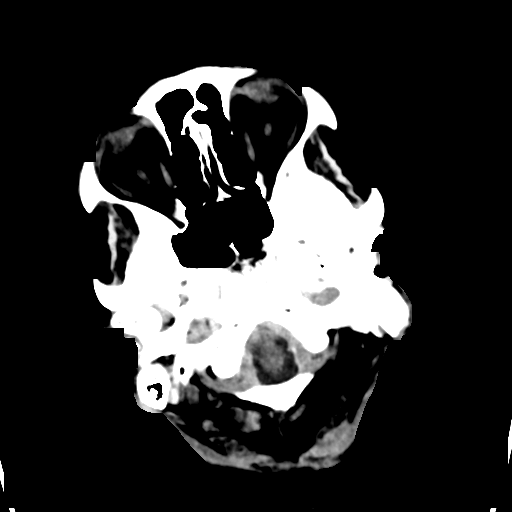
[im 5/33  bone]
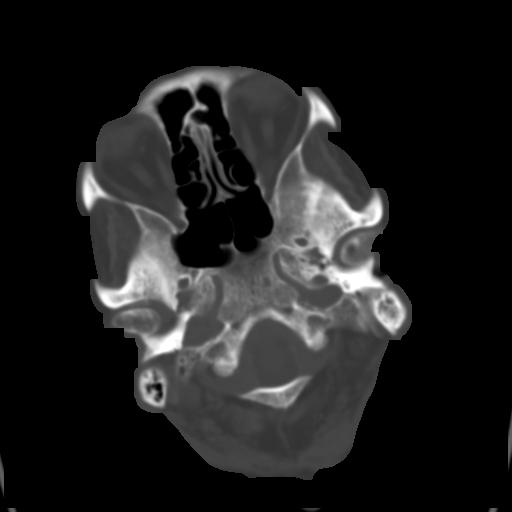
[im 9/33  brain]
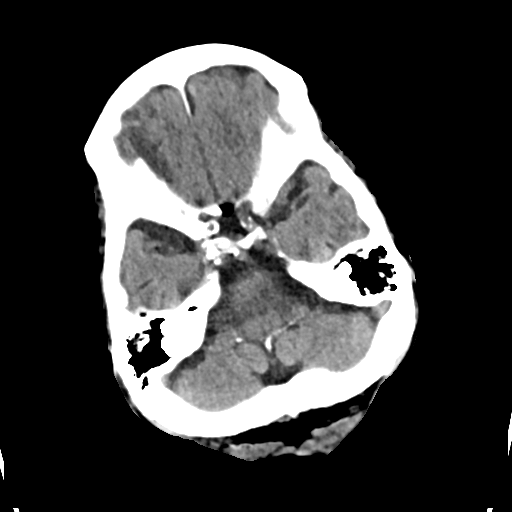
[im 13/33  brain]
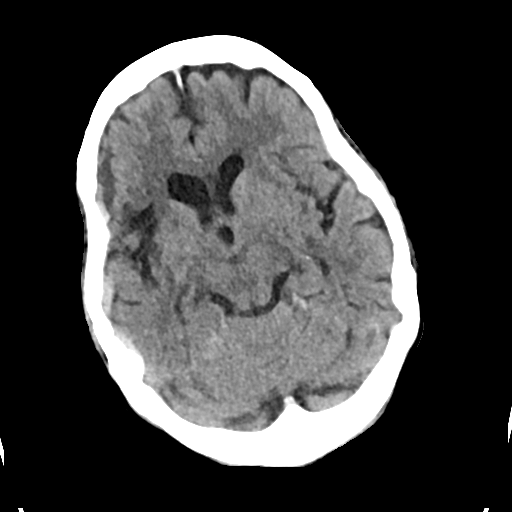
[im 17/33  brain]
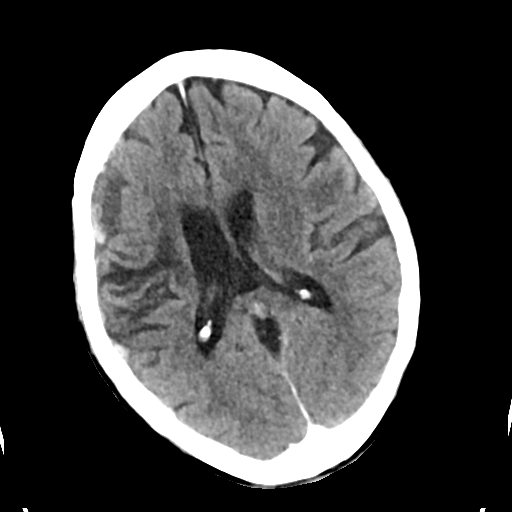
[im 21/33  brain]
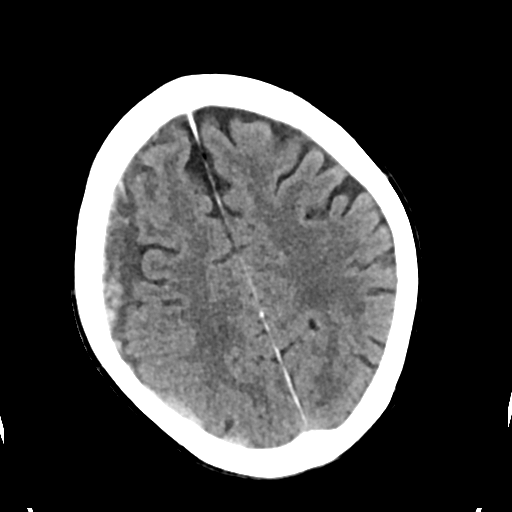
[im 21/33  bone]
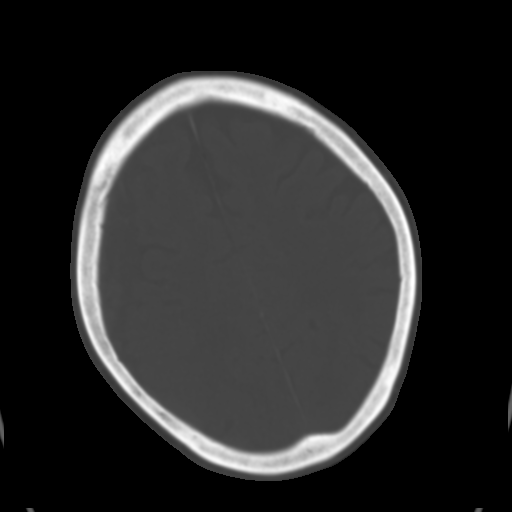
[im 25/33  brain]
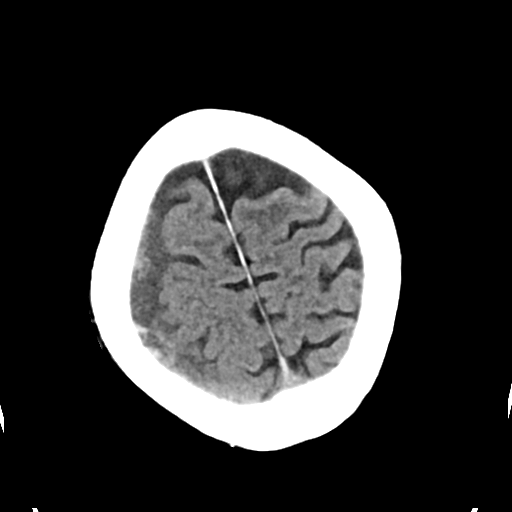
[im 29/33  brain]
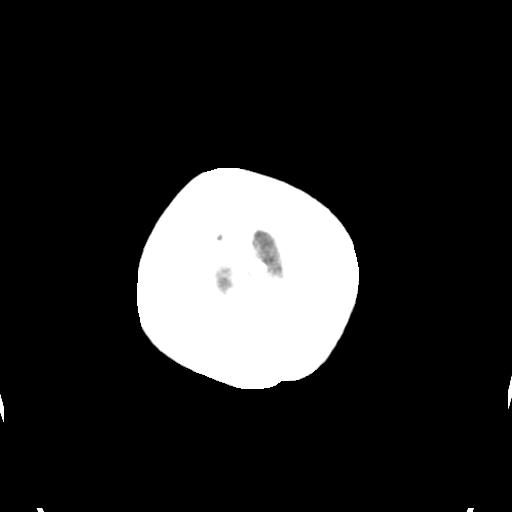

[Series 4: head bone · axial · 0.41mm/px · z∈[-116,-60]mm · 4 of 82 slices shown]
[im 9/82  bone]
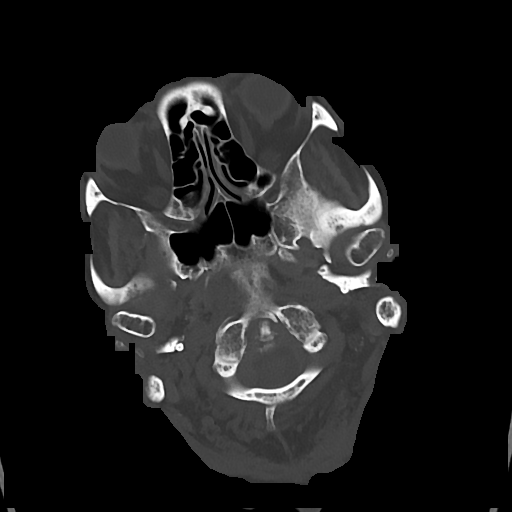
[im 17/82  bone]
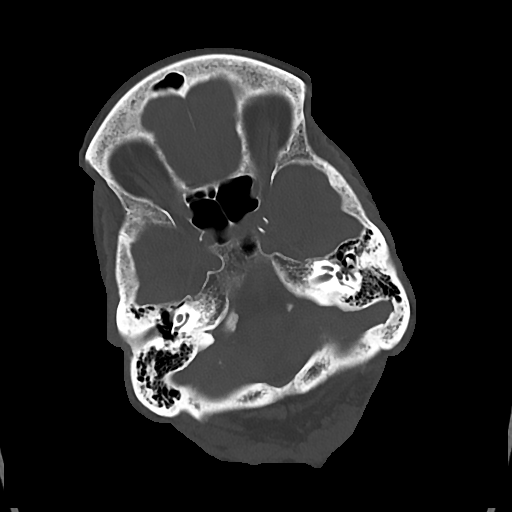
[im 25/82  bone]
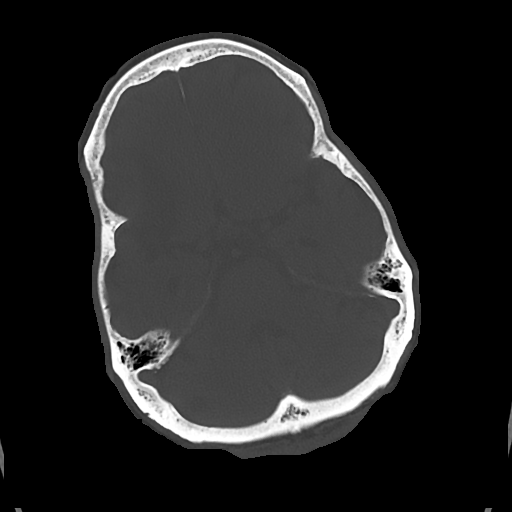
[im 37/82  bone]
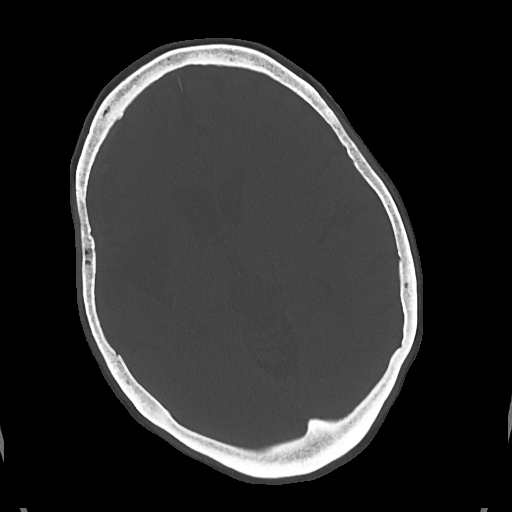

[Series 5: cor soft · coronal · 0.34mm/px · 3 of 71 slices shown]
[im 27/71  brain]
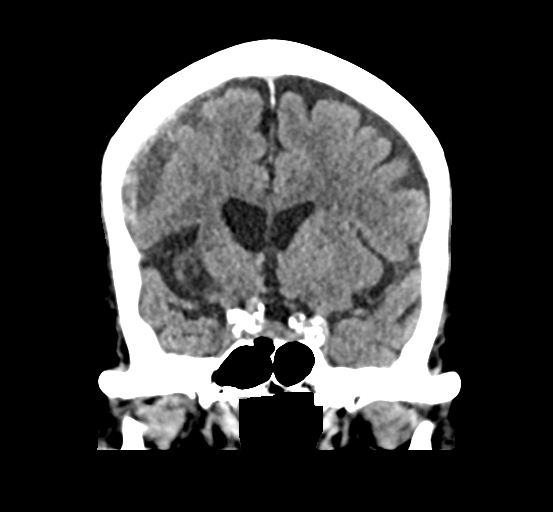
[im 33/71  brain]
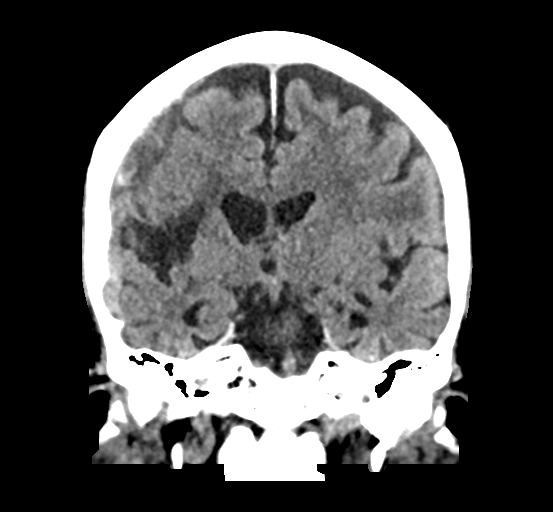
[im 38/71  brain]
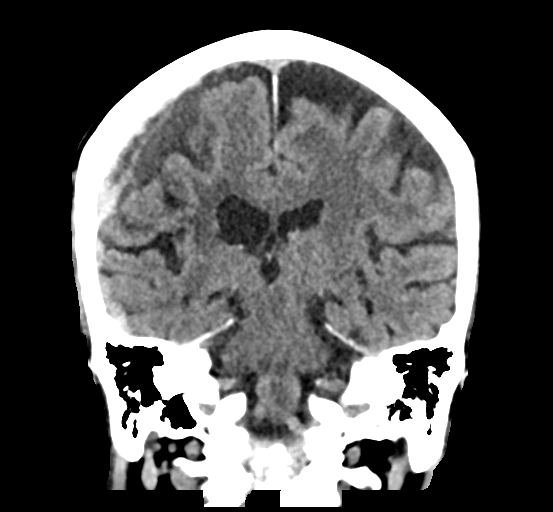

[Series 6: sag soft · sagittal · 0.34mm/px · 3 of 60 slices shown]
[im 20/60  brain]
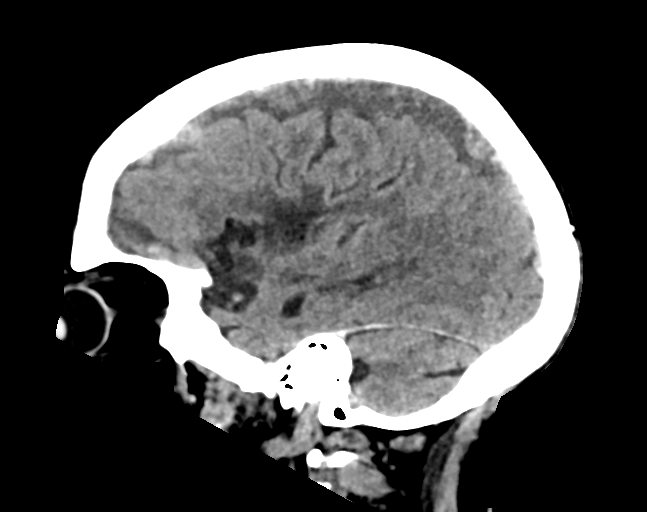
[im 30/60  brain]
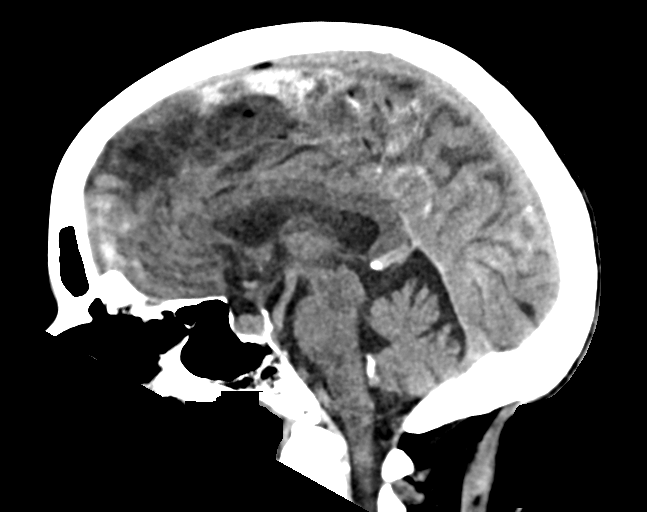
[im 40/60  brain]
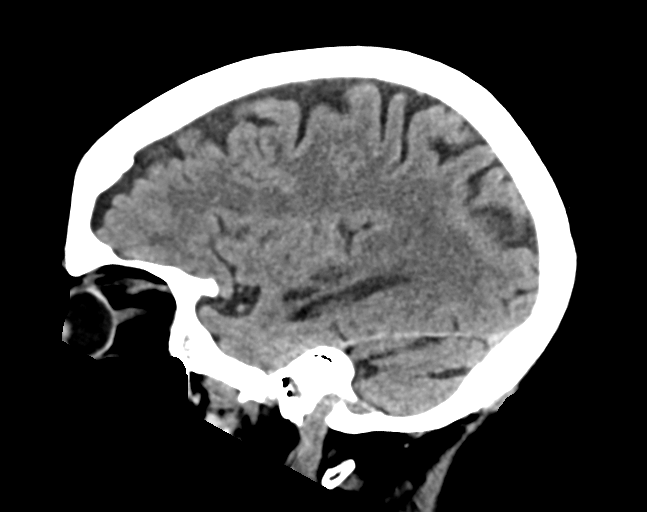

[17 of 47 positions shown; findings below may reference images not displayed]

FINDINGS: Brain: Stable right frontal subdural hematoma is noted of mixed
density. Ventricular size is within normal limits. No significant
midline shift is noted. Mild chronic ischemic white matter disease
is noted. No acute infarction or mass lesion is noted.

Vascular: No hyperdense vessel or unexpected calcification.

Skull: Normal. Negative for fracture or focal lesion.

Sinuses/Orbits: No acute finding.

Other: None.
IMPRESSION: Stable right frontal subdural hematoma is noted.
